# Patient Record
Sex: Female | Born: 1960 | Race: White | Hispanic: No | Marital: Married | Smoking: Never smoker
Health system: Southern US, Community
[De-identification: ages and names within clinical notes are randomized; demographics above are authoritative.]

## PROBLEM LIST (undated history)

## (undated) DIAGNOSIS — C569 Malignant neoplasm of unspecified ovary: Secondary | ICD-10-CM

## (undated) DIAGNOSIS — N289 Disorder of kidney and ureter, unspecified: Secondary | ICD-10-CM

## (undated) DIAGNOSIS — M1990 Unspecified osteoarthritis, unspecified site: Secondary | ICD-10-CM

## (undated) DIAGNOSIS — M329 Systemic lupus erythematosus, unspecified: Secondary | ICD-10-CM

## (undated) DIAGNOSIS — IMO0002 Reserved for concepts with insufficient information to code with codable children: Secondary | ICD-10-CM

## (undated) DIAGNOSIS — I10 Essential (primary) hypertension: Secondary | ICD-10-CM

## (undated) DIAGNOSIS — E7800 Pure hypercholesterolemia, unspecified: Secondary | ICD-10-CM

## (undated) DIAGNOSIS — C801 Malignant (primary) neoplasm, unspecified: Secondary | ICD-10-CM

## (undated) DIAGNOSIS — E079 Disorder of thyroid, unspecified: Secondary | ICD-10-CM

## (undated) HISTORY — PX: EXTERNAL EAR SURGERY: SHX627

---

## 2004-10-18 ENCOUNTER — Ambulatory Visit: Payer: Self-pay | Admitting: Gastroenterology

## 2004-11-17 ENCOUNTER — Ambulatory Visit: Payer: Self-pay | Admitting: Gastroenterology

## 2005-09-27 ENCOUNTER — Ambulatory Visit: Payer: Self-pay | Admitting: Family Medicine

## 2005-10-15 ENCOUNTER — Emergency Department: Payer: Self-pay | Admitting: Emergency Medicine

## 2005-10-19 ENCOUNTER — Emergency Department: Payer: Self-pay | Admitting: Emergency Medicine

## 2005-10-22 IMAGING — CT CT ABD-PELV W/ CM
1 of 3 series · 14 of 32 positions shown, 19 images · non-contrast
Comparison: none

REASON FOR EXAM: Abdominal pain
COMMENTS:

[Series 2: soft tissue · axial · 0.72mm/px · z∈[-482,-106]mm · 14 of 55 slices shown, 19 images]
[im 4/55  soft-tissue]
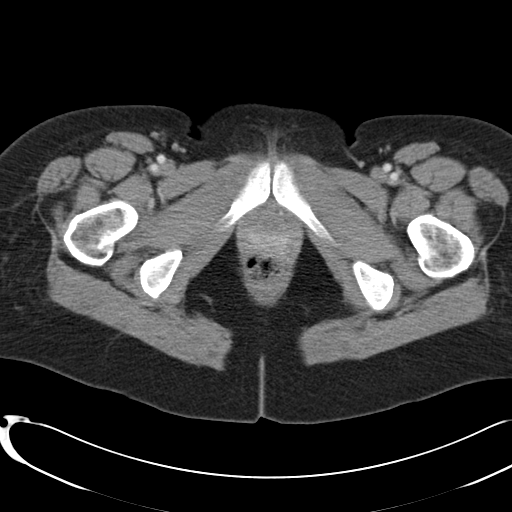
[im 4/55  bone]
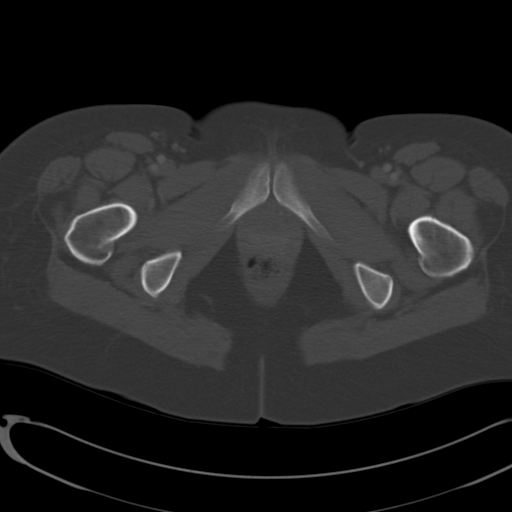
[im 7/55  soft-tissue]
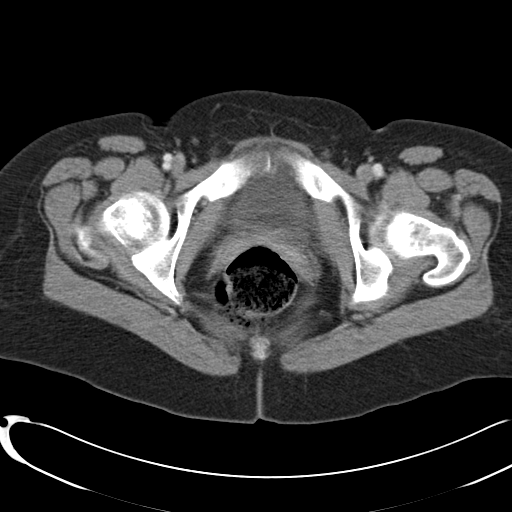
[im 13/55  soft-tissue]
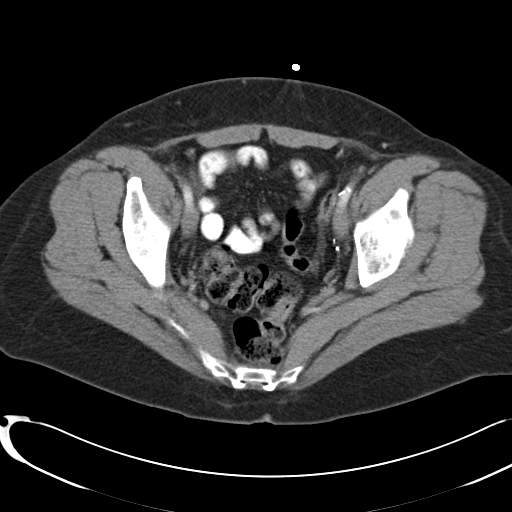
[im 16/55  soft-tissue]
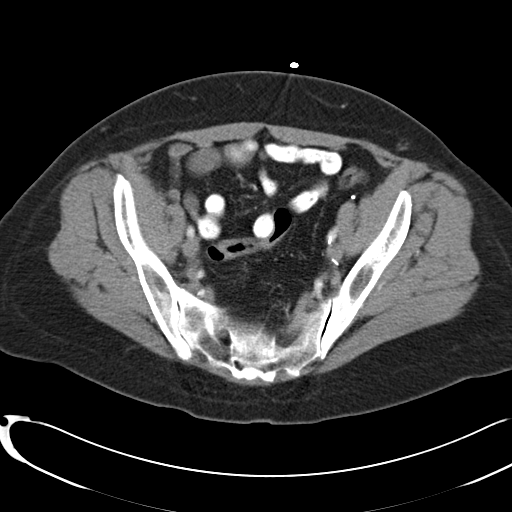
[im 20/55  soft-tissue]
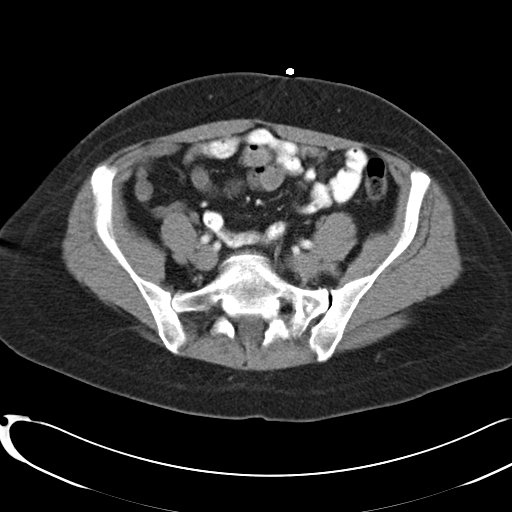
[im 23/55  soft-tissue]
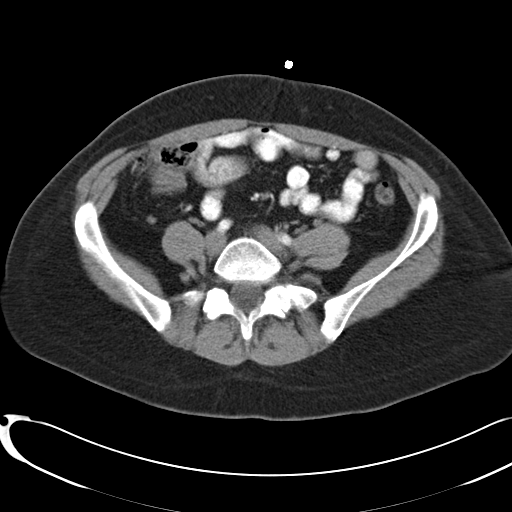
[im 29/55  soft-tissue]
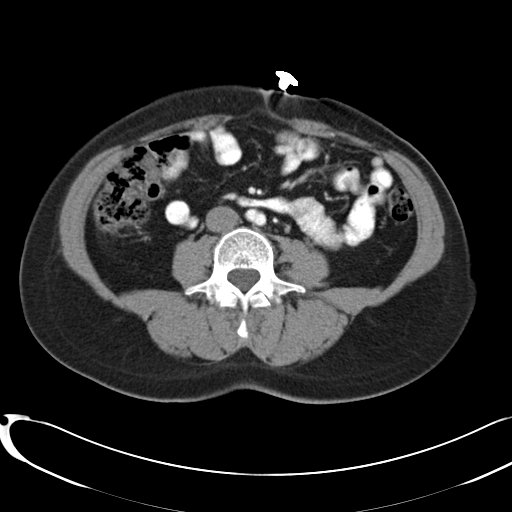
[im 32/55  soft-tissue]
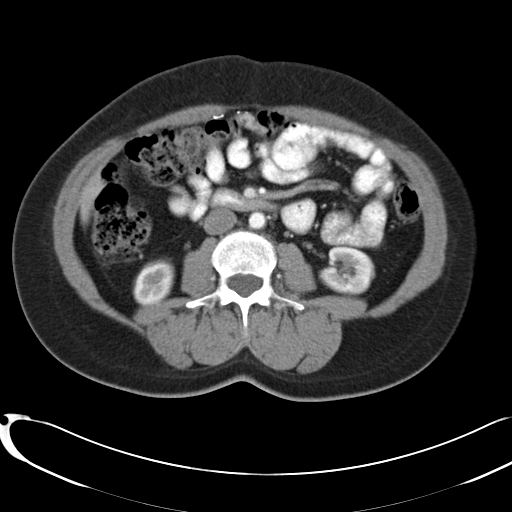
[im 35/55  soft-tissue]
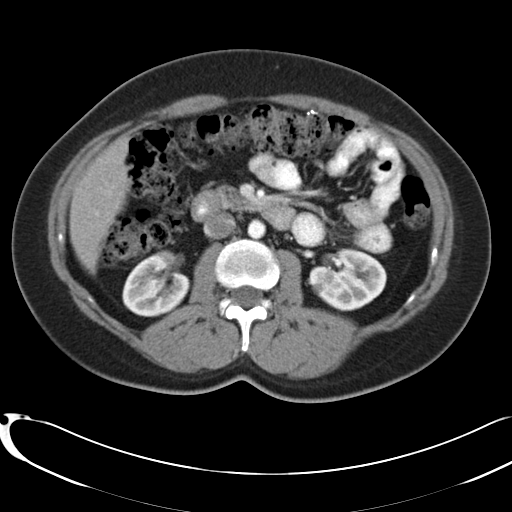
[im 35/55  bone]
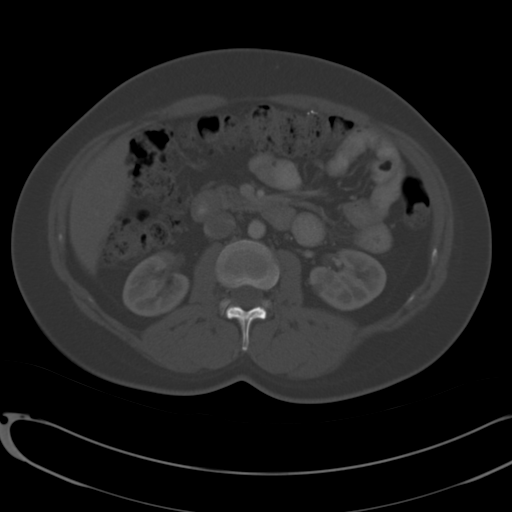
[im 39/55  soft-tissue]
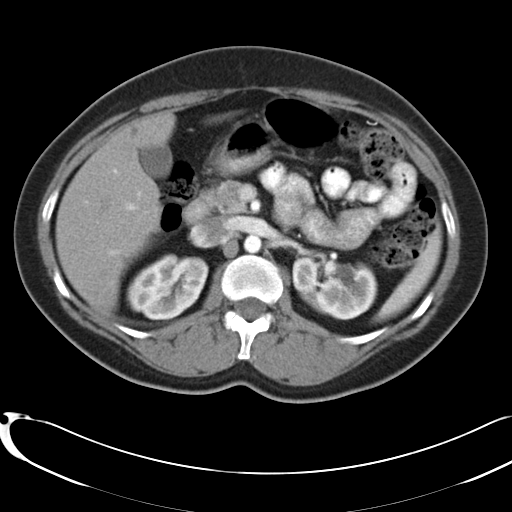
[im 42/55  soft-tissue]
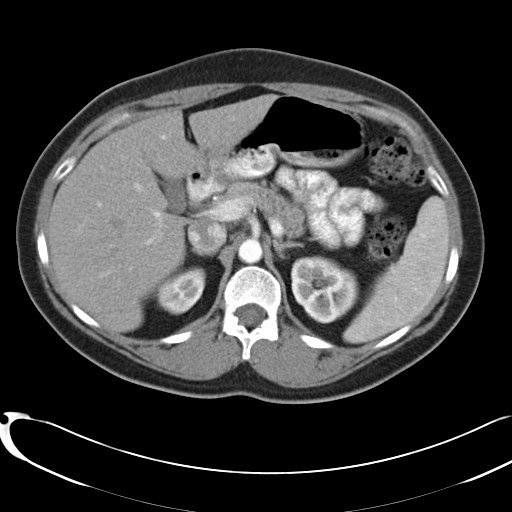
[im 42/55  lung]
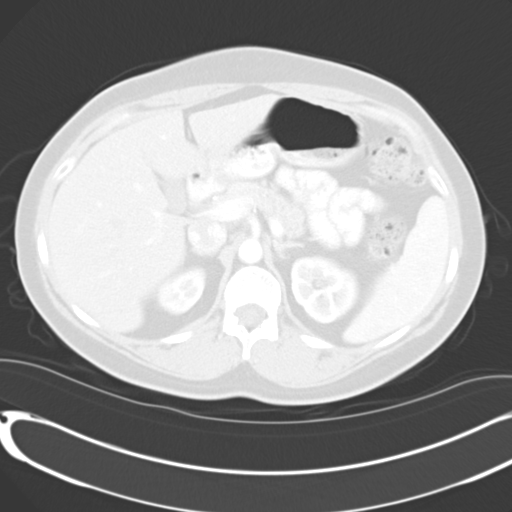
[im 45/55  lung]
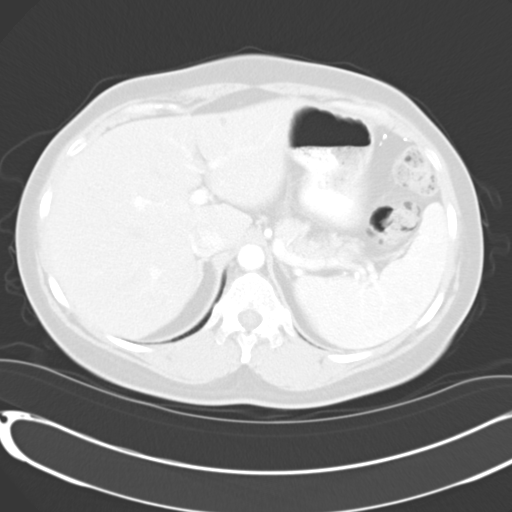
[im 48/55  soft-tissue]
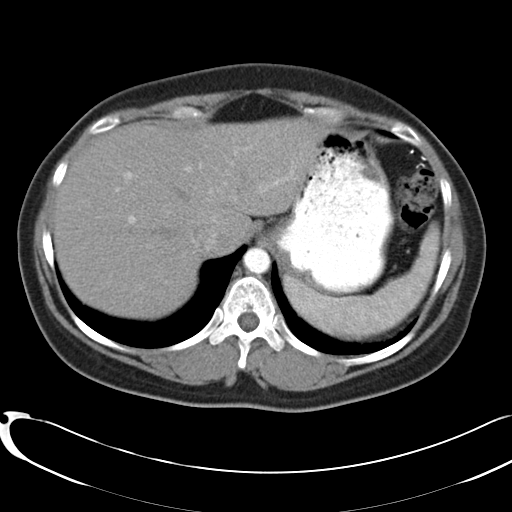
[im 48/55  lung]
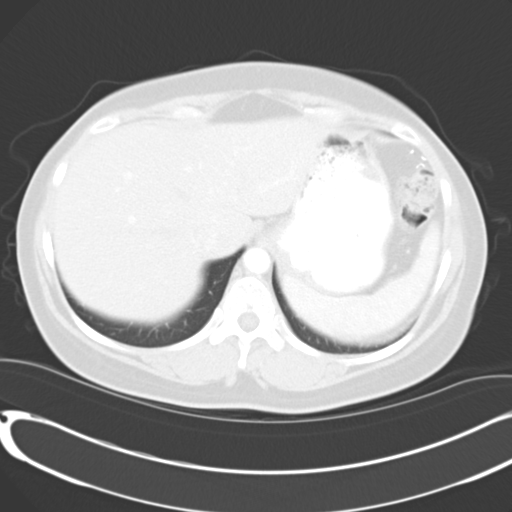
[im 51/55  soft-tissue]
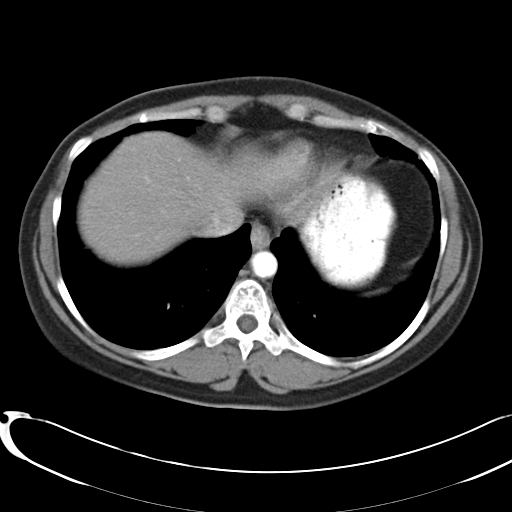
[im 51/55  lung]
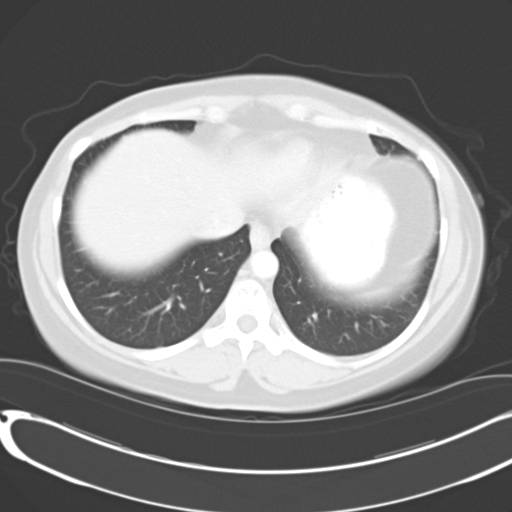

[14 of 32 positions shown; findings below may reference images not displayed]

PROCEDURE:     CT  - CT ABDOMEN / PELVIS  W  - October 18, 2004  [DATE]

RESULT:     8-mm helical cuts through the abdomen and pelvis were performed
with oral and 100 cc of Ysovue-NJB contrast.  Cuts through the lung bases
show no suspicious mass, nodule, or pneumonia.  The soft tissue windows
shows no pleural or pericardial effusion.  Cuts through the abdomen show
multiple low-density lesions in both lobes of the liver.  They likely
represent simple cysts or hemangiomas.  The largest is in the RIGHT lobe and
measures 1 cm in length.  No suspicious peripherally enhancing lesion is
identified.  The spleen, gallbladder, pancreas, adrenals and kidneys are
unremarkable.  No free intraperitoneal fluid, air, or adenopathy is noted.
Diverticular disease is seen in the sigmoid colon without evidence of acute
diverticulitis or abscess.  The bladder distends normally without evidence
of filling defect or wall thickening.  No inguinal mass or adenopathy is
noted.  The delayed images show persistence of these low-density lesions but
they appear to be smaller on the delayed studies suggesting they are most
likely hemangiomas.  Delayed images also show a subtle low-density lesion in
the midportion of the LEFT kidney which I suspect is volume averaging as it
is essentially too small to characterize.  Neither kidney shows obvious
obstructive uropathy.
IMPRESSION: Multiple low-density lesions are noted throughout both lobes of the liver.
The largest is in the RIGHT lobe and measures 1.1 cm.  They appear to be
smaller on the delayed study suggesting they are likely  hemangiomas.

No other suspicious solid organ abnormality is identified.  No free
intraperitoneal fluid, air, or adenopathy.

No evidence of mesenteric inflammatory process.

Lung bases are clear.

## 2006-10-01 IMAGING — CT CT HEAD WITHOUT AND WITH CONTRAST
2 series · 15 of 30 positions shown, 19 images · non-contrast
Comparison: none

REASON FOR EXAM: HA EVAL INTRACRANIAL PROCESS VS. CHRONIC SINUSITIS
COMMENTS:

[Series 2: without · axial · non-contrast · 0.41mm/px · z∈[-214,-84]mm · 13 of 32 slices shown, 17 images]
[im 3/32  brain]
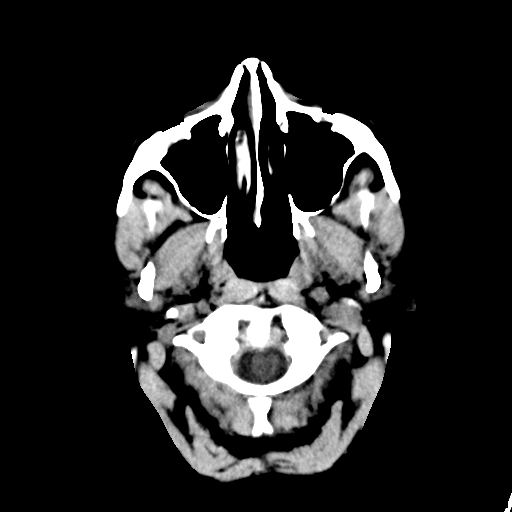
[im 3/32  bone]
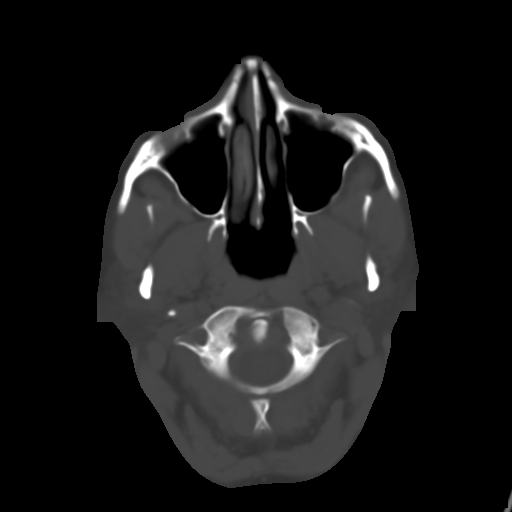
[im 5/32  brain]
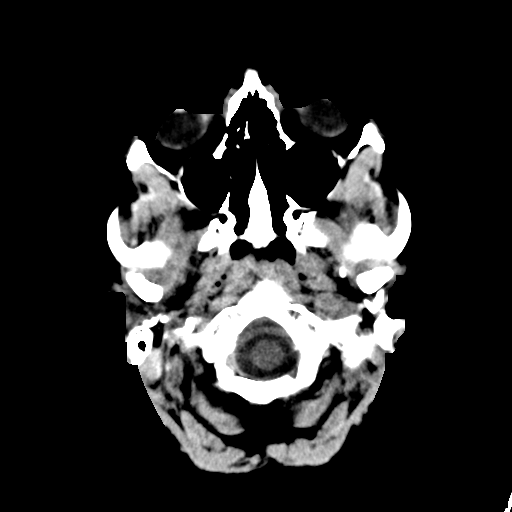
[im 7/32  brain]
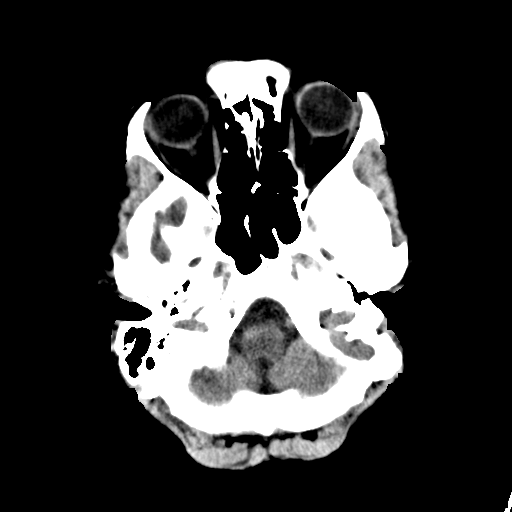
[im 9/32  brain]
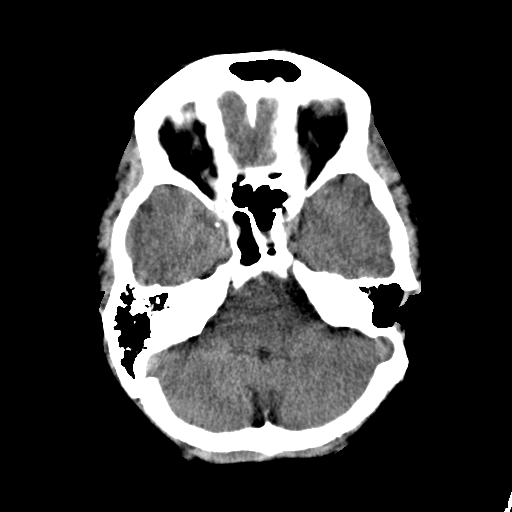
[im 12/32  brain]
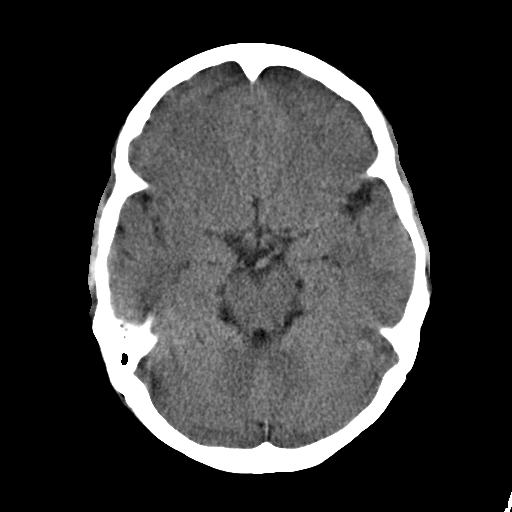
[im 12/32  bone]
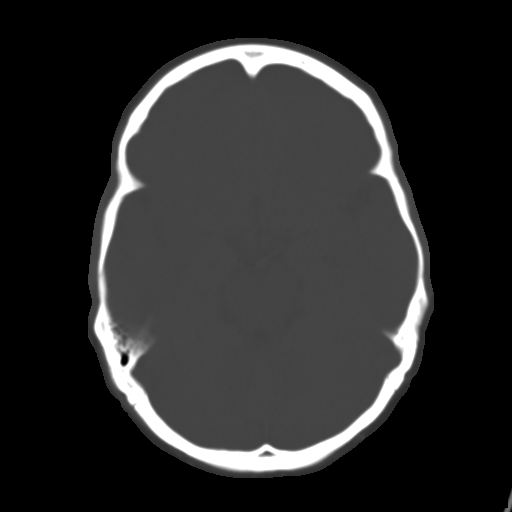
[im 14/32  brain]
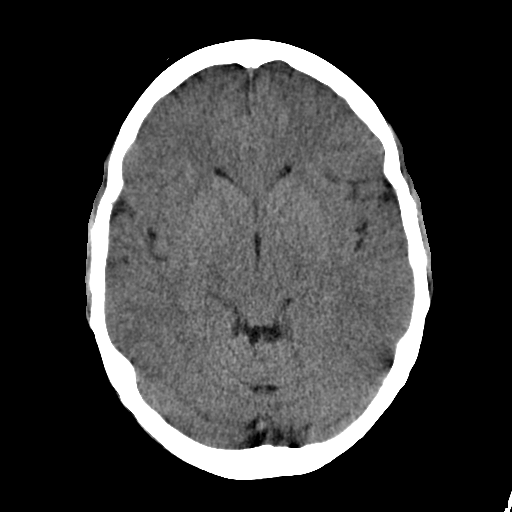
[im 16/32  brain]
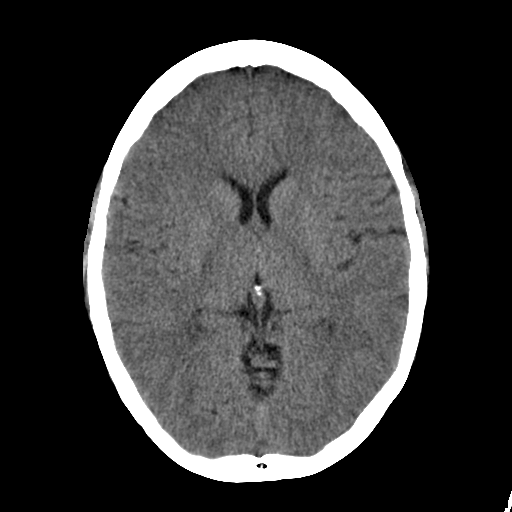
[im 18/32  brain]
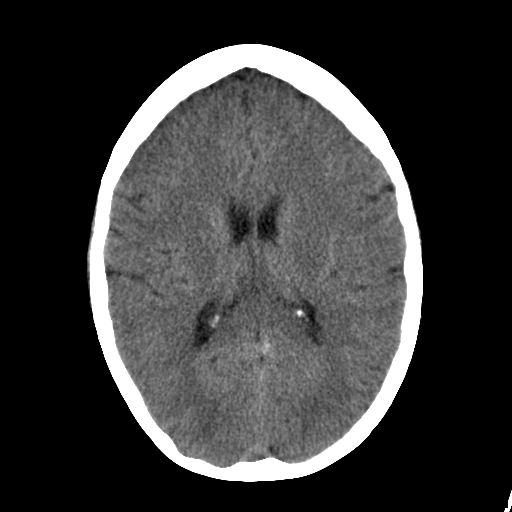
[im 20/32  brain]
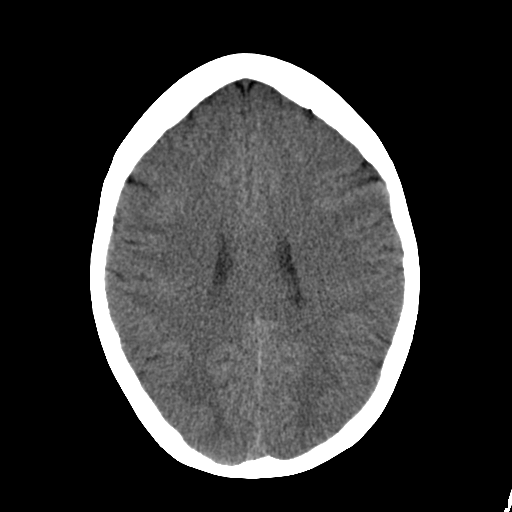
[im 20/32  bone]
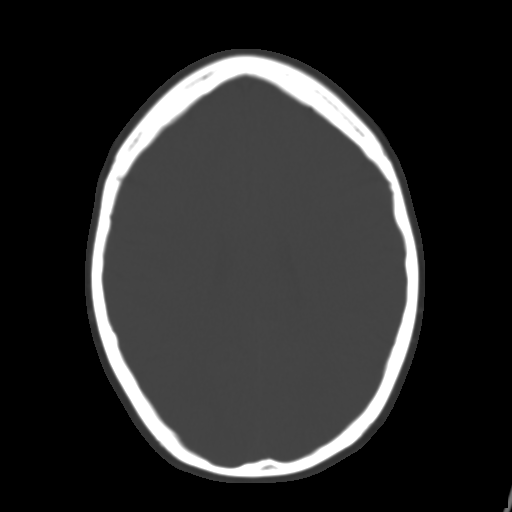
[im 23/32  brain]
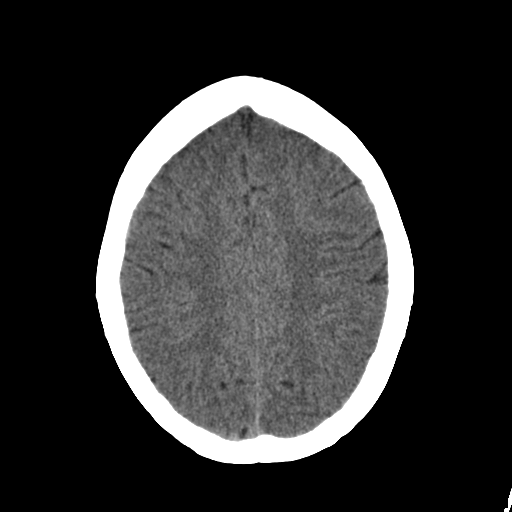
[im 25/32  brain]
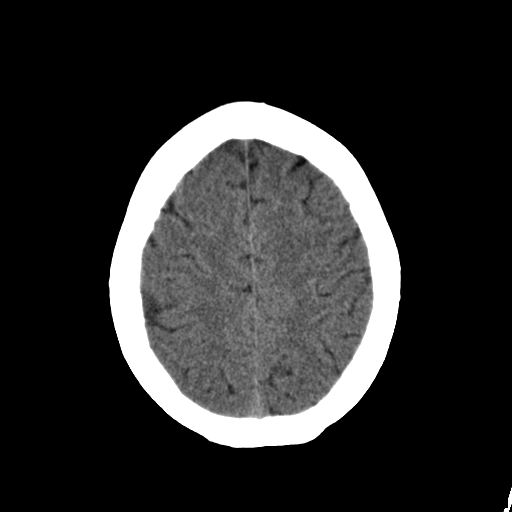
[im 27/32  brain]
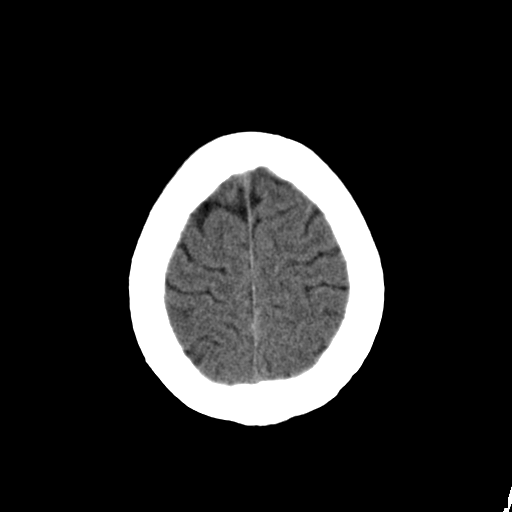
[im 29/32  brain]
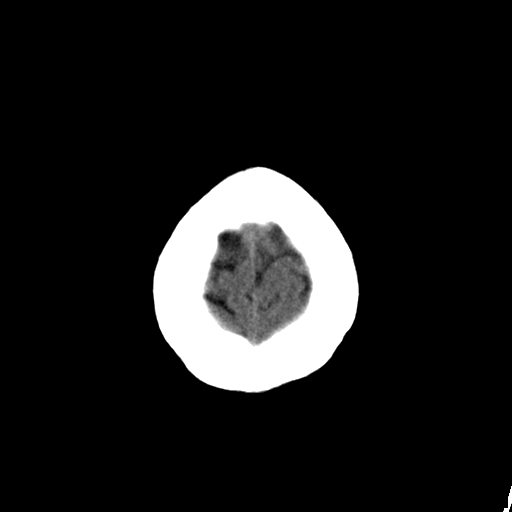
[im 29/32  bone]
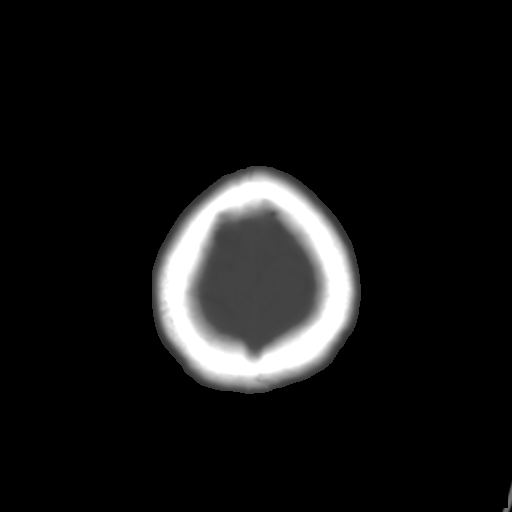

[Series 3: bone · axial · 0.41mm/px · z∈[-214,-194]mm · 2 of 32 slices shown]
[im 3/32  bone]
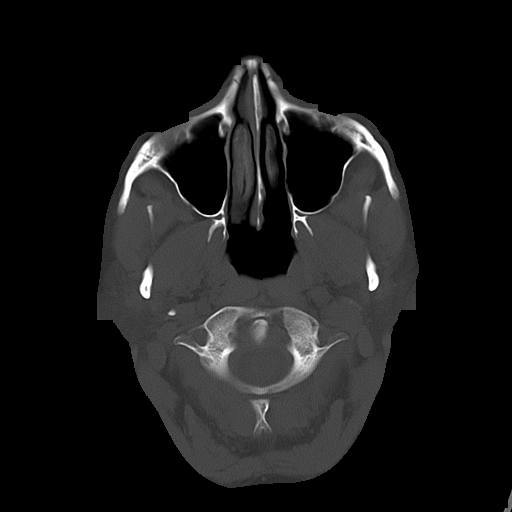
[im 7/32  bone]
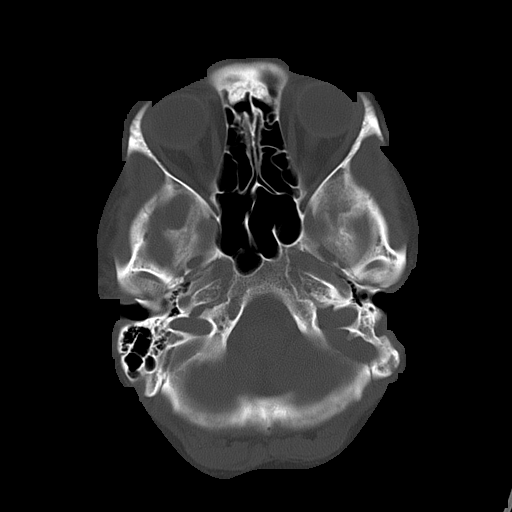

[15 of 30 positions shown; findings below may reference images not displayed]

PROCEDURE:     CT  - CT HEAD W/WO  - September 27, 2005  [DATE]

RESULT:     The patient has no prior study for comparison.  The pre-contrast
images show the ventricles and sulci appear to be within normal limits.
There is no evidence of an area of hemorrhage. There is no mass effect or
midline shift. There is no extra-axial hemorrhage.

Following intravenous administration of Gadolinium there is no evidence of
an abnormal area of enhancement to suggest an underlying mass or vascular
malformation.

Bone window images show normal aeration of the paranasal sinuses included on
the study. No underlying calvarial mass or destructive process is
demonstrated.
IMPRESSION: 1)No significant intracranial abnormality.

2)No CT evidence of acute sinusitis although only the mid portion of the
maxillary sinuses through the frontal sinuses is included and the floor of
the maxillary sinuses were not seen.

## 2006-10-23 IMAGING — CT CT STONE STUDY
1 of 2 series · 16 of 32 positions shown, 20 images · non-contrast
Comparison: none

REASON FOR EXAM: rm 10   back pain      stone study
COMMENTS:

[Series 2: stone · axial · 0.73mm/px · z∈[-440,-32]mm · 16 of 153 slices shown, 20 images]
[im 11/153  soft-tissue]
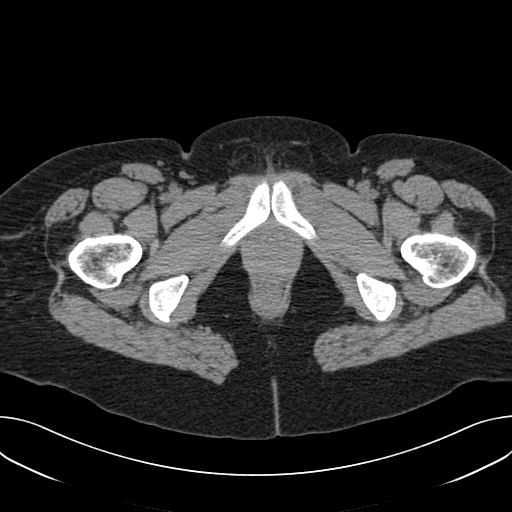
[im 11/153  bone]
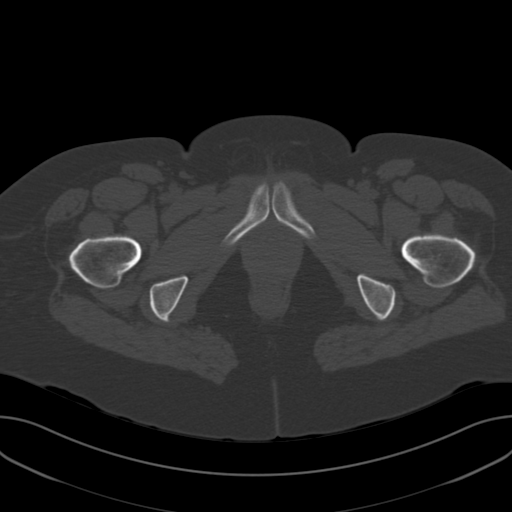
[im 21/153  soft-tissue]
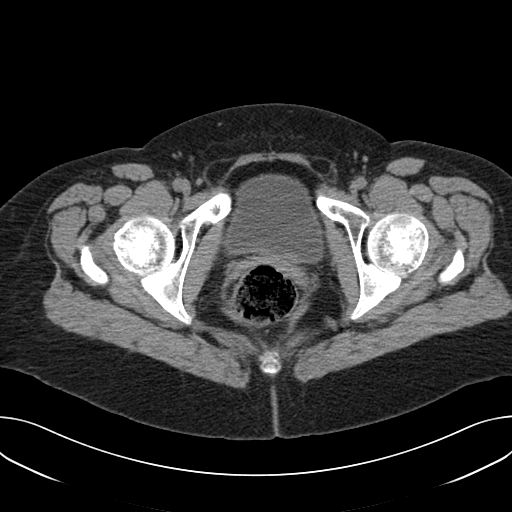
[im 32/153  soft-tissue]
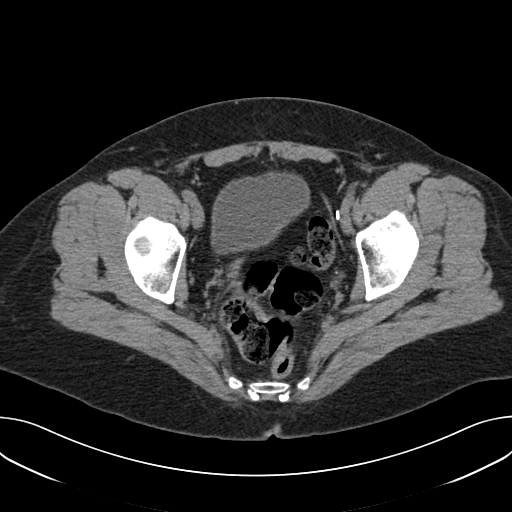
[im 42/153  soft-tissue]
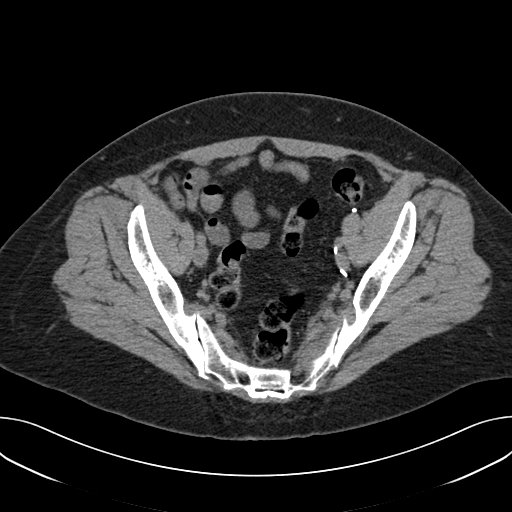
[im 53/153  soft-tissue]
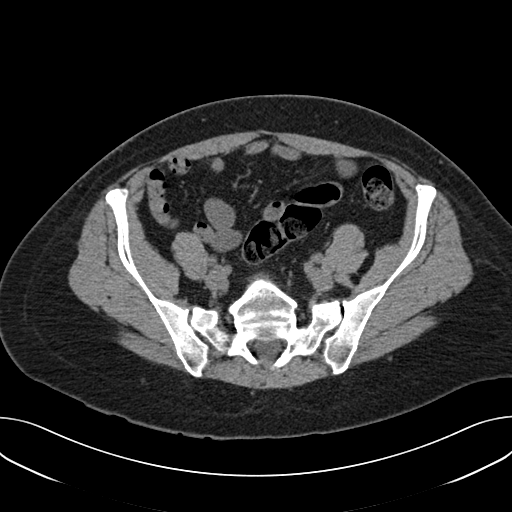
[im 63/153  soft-tissue]
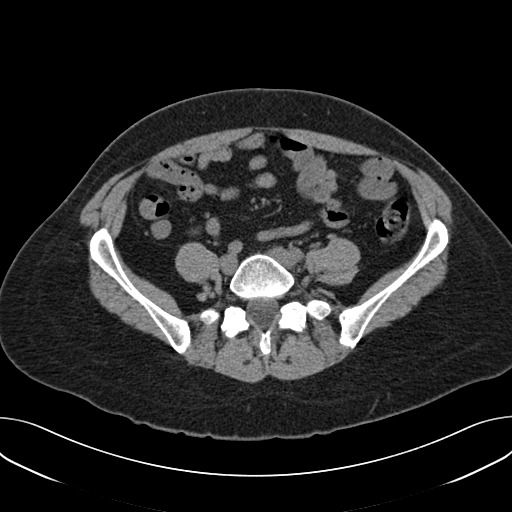
[im 74/153  soft-tissue]
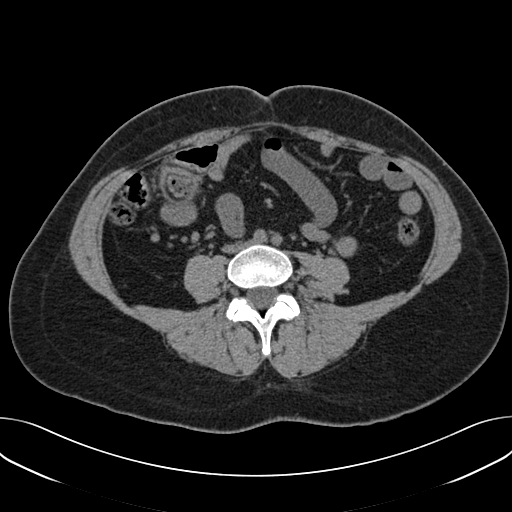
[im 84/153  soft-tissue]
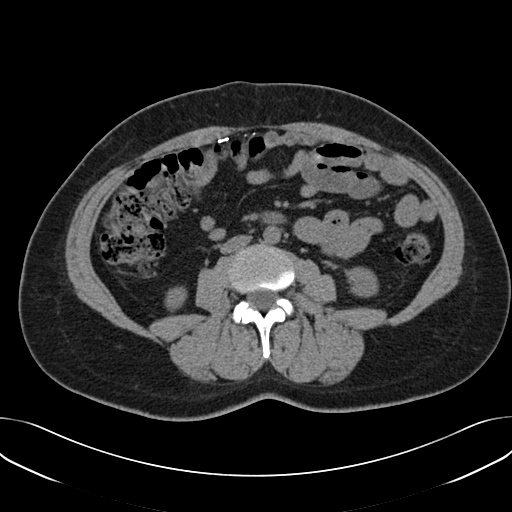
[im 95/153  soft-tissue]
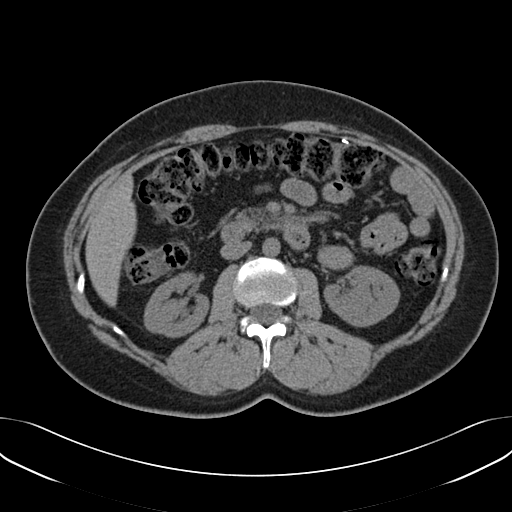
[im 95/153  bone]
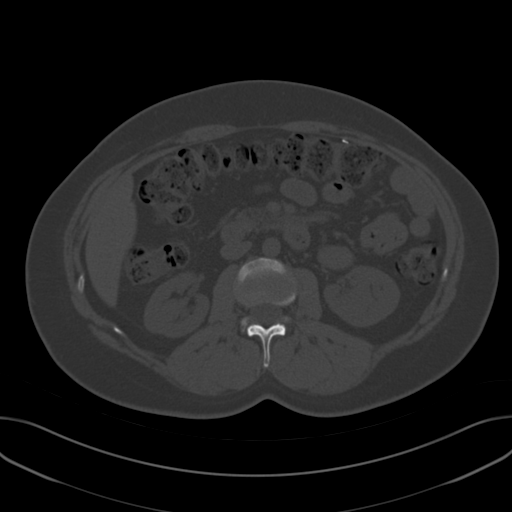
[im 105/153  soft-tissue]
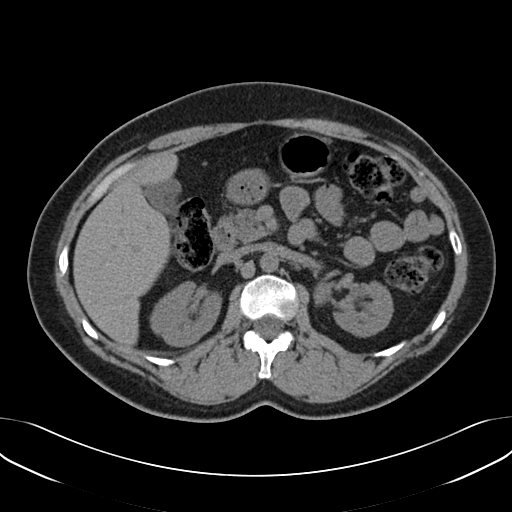
[im 116/153  soft-tissue]
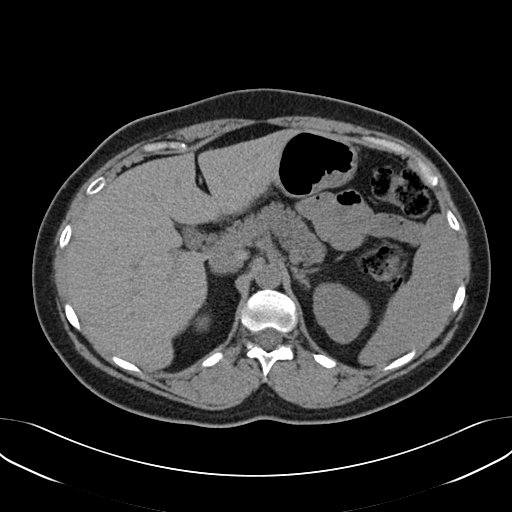
[im 126/153  soft-tissue]
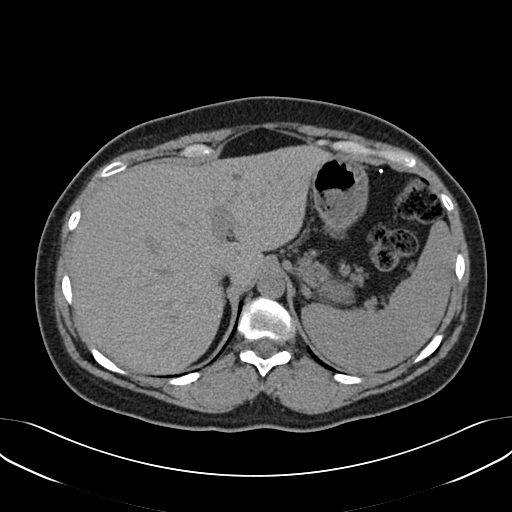
[im 132/153  lung]
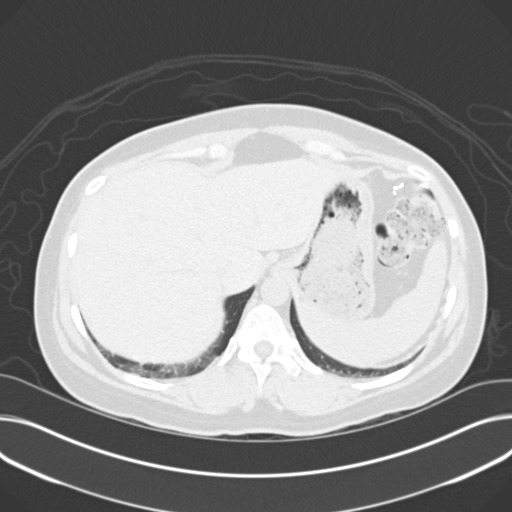
[im 137/153  soft-tissue]
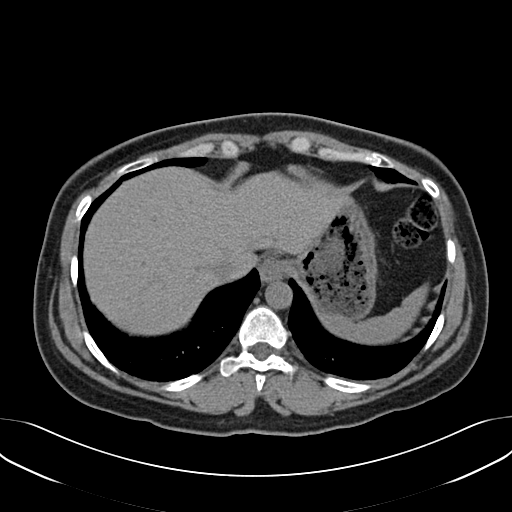
[im 137/153  lung]
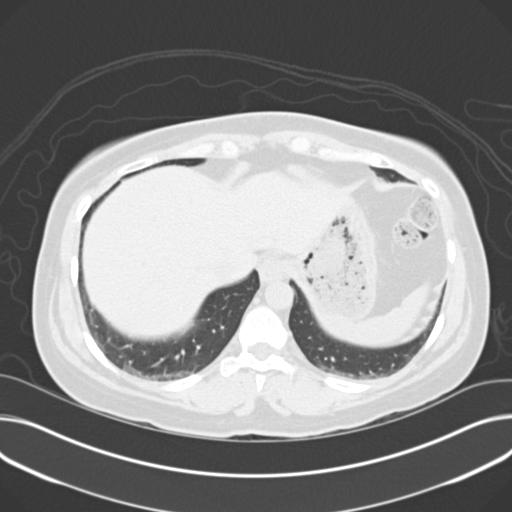
[im 142/153  lung]
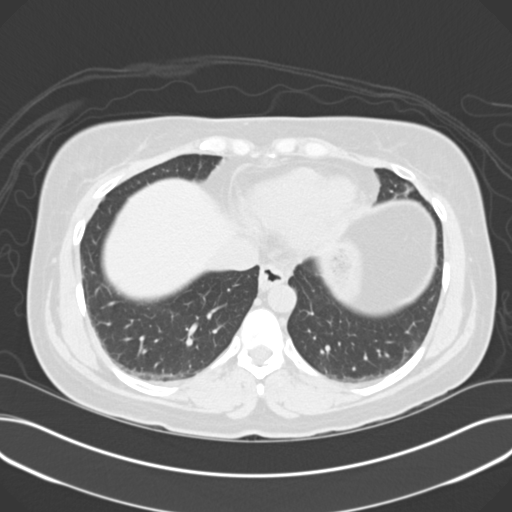
[im 147/153  soft-tissue]
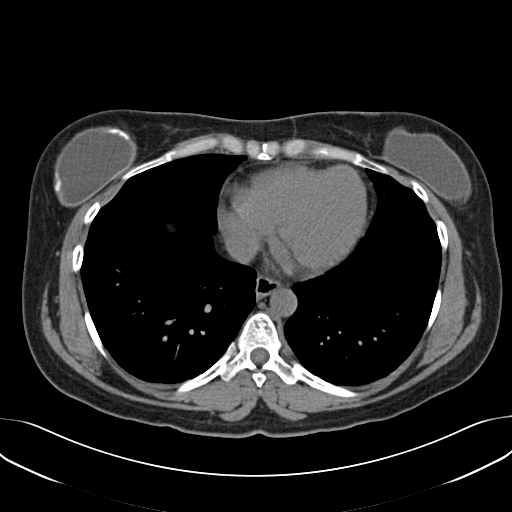
[im 147/153  lung]
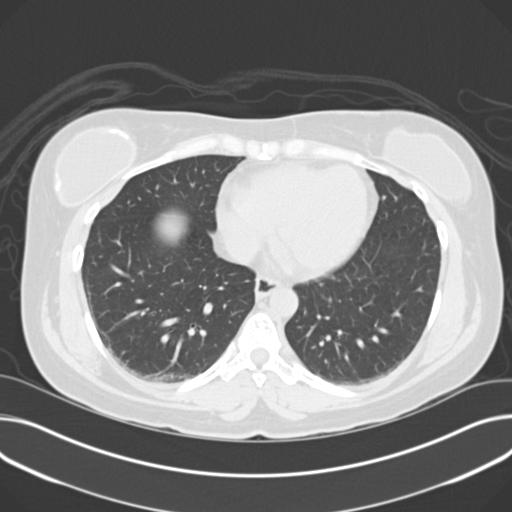

[16 of 32 positions shown; findings below may reference images not displayed]

PROCEDURE:     CT  - CT ABDOMEN /PELVIS WO (STONE)  - October 20, 2005 [DATE]

RESULT:     Non-contrast 3 mm sections were obtained from the lung bases
through the pubic symphysis.

Evaluation of the lung bases demonstrates bibasilar hypoventilation.
Bilateral breast implants are appreciated.

There is no evidence of hydronephrosis, masses nor calculi.  Incidental note
is made of duplication of the LEFT urinary collecting system.  Non-contrast
evaluation of the solid abdominal organ viscera demonstrates no
abnormalities. There is no evidence of abdominal or pelvic free fluid,
drainable loculated fluid collections or masses. Evaluation of the RIGHT and
LEFT kidneys demonstrates no hydronephrosis, masses or calculi.
IMPRESSION: 1)No evidence of renal calculus disease as described above.

2)A preliminary faxed report was relayed to Dr. Dandla Alanazi of the

## 2008-07-16 ENCOUNTER — Emergency Department: Payer: Self-pay | Admitting: Emergency Medicine

## 2008-09-08 ENCOUNTER — Emergency Department: Payer: Self-pay | Admitting: Emergency Medicine

## 2008-12-02 ENCOUNTER — Emergency Department: Payer: Self-pay | Admitting: Unknown Physician Specialty

## 2009-07-20 IMAGING — CT CT HEAD WITHOUT CONTRAST
2 series · 16 of 30 positions shown, 20 images · non-contrast
Comparison: none

REASON FOR EXAM: headache
COMMENTS:

PROCEDURE:     CT  - CT HEAD WITHOUT CONTRAST  - July 16, 2008  [DATE]
RESULT:     Comparison: 09/27/2005
TECHNIQUE: Multiple axial images from the foramen magnum to the vertex were
obtained without IV contrast.

[Series 2: without · axial · non-contrast · 0.39mm/px · z∈[+1149,+1274]mm · 13 of 31 slices shown, 17 images]
[im 3/31  brain]
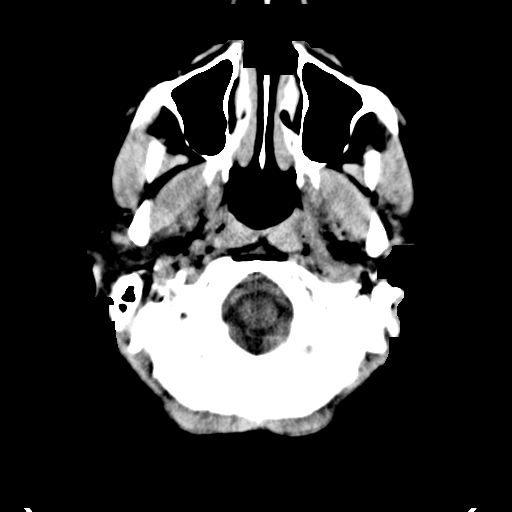
[im 3/31  bone]
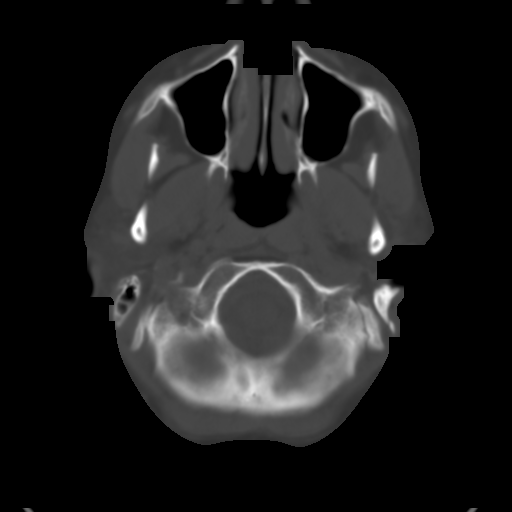
[im 5/31  brain]
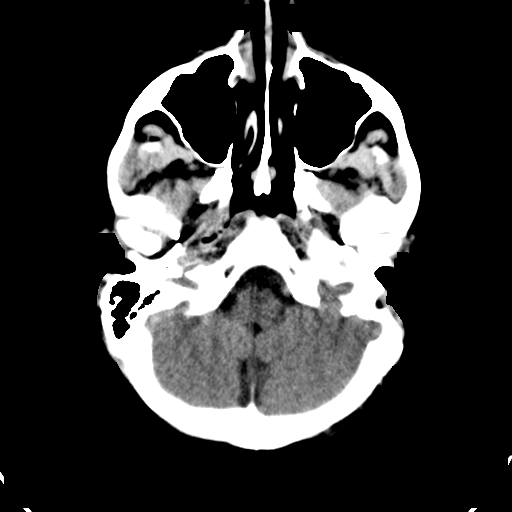
[im 7/31  brain]
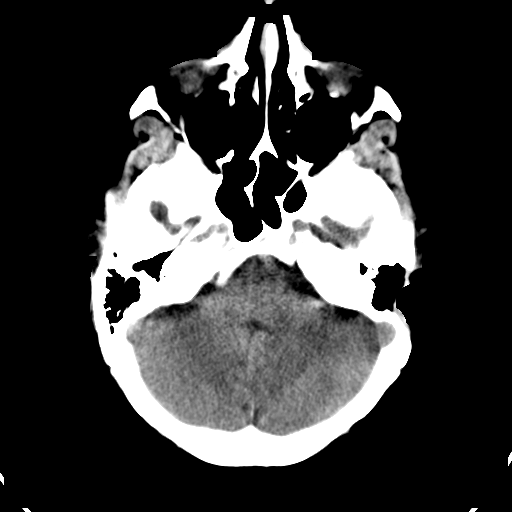
[im 9/31  brain]
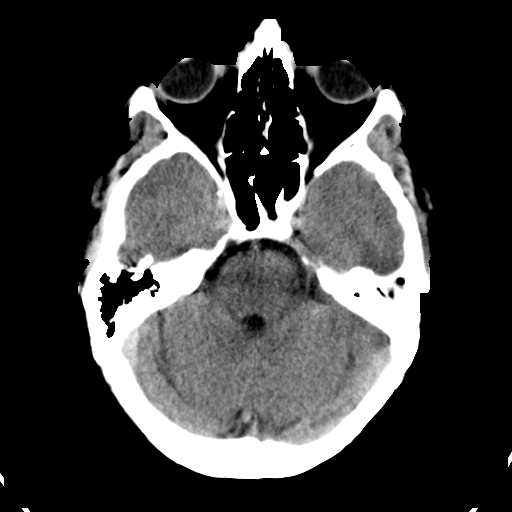
[im 11/31  brain]
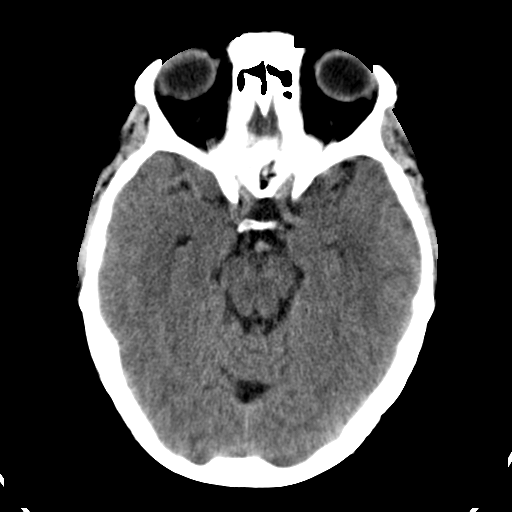
[im 11/31  bone]
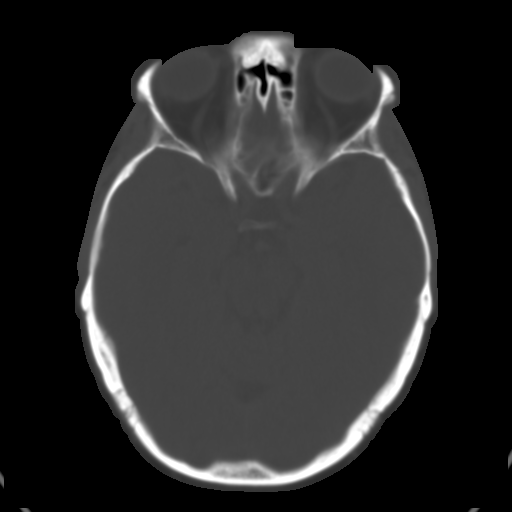
[im 13/31  brain]
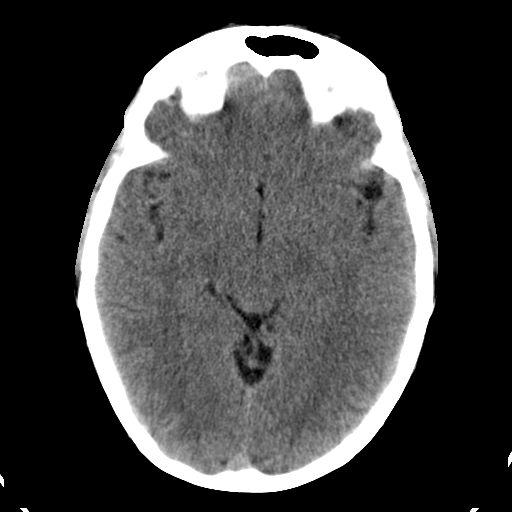
[im 16/31  brain]
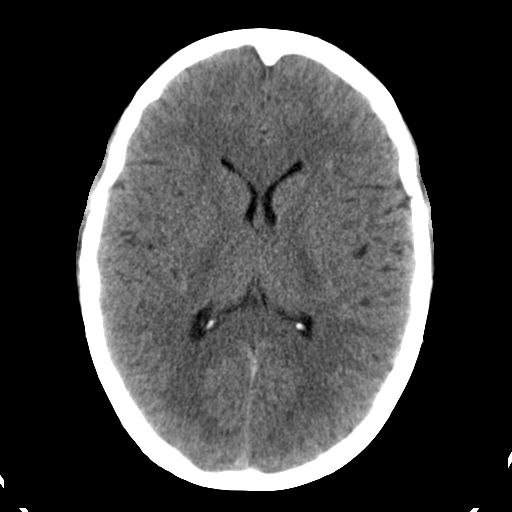
[im 18/31  brain]
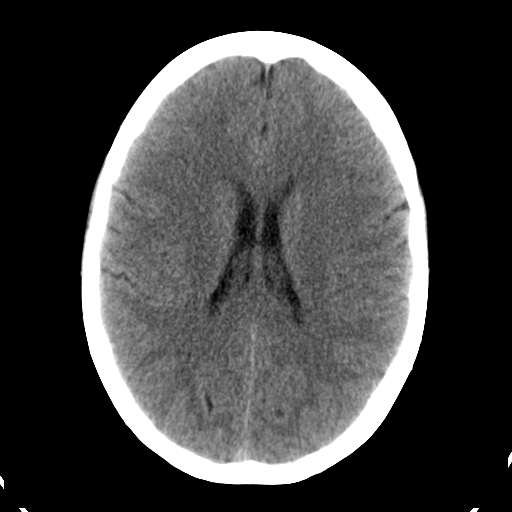
[im 20/31  brain]
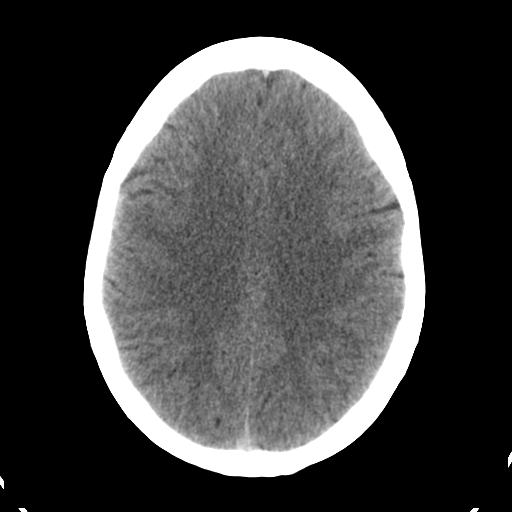
[im 20/31  bone]
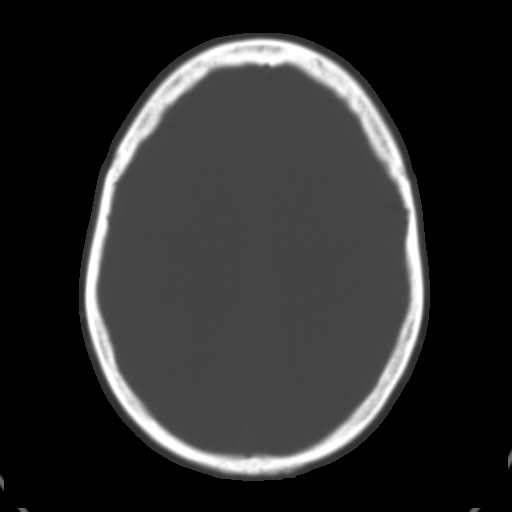
[im 22/31  brain]
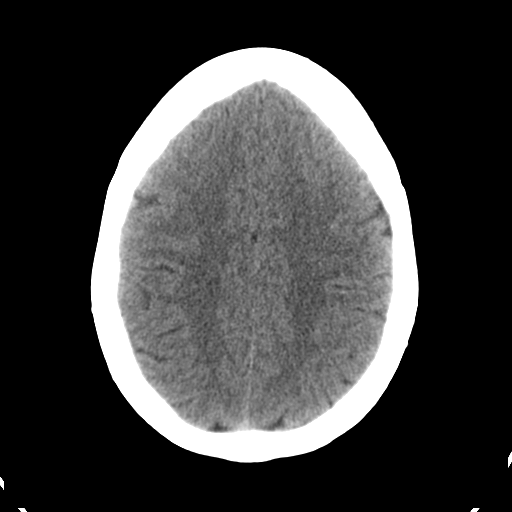
[im 24/31  brain]
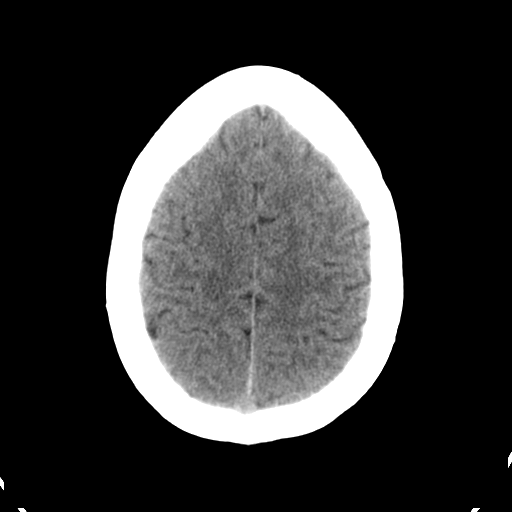
[im 26/31  brain]
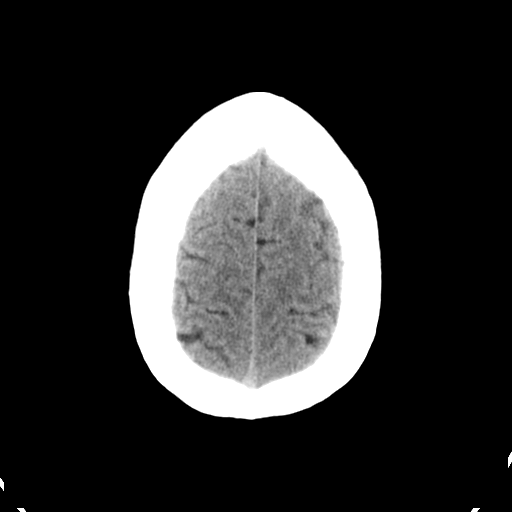
[im 28/31  brain]
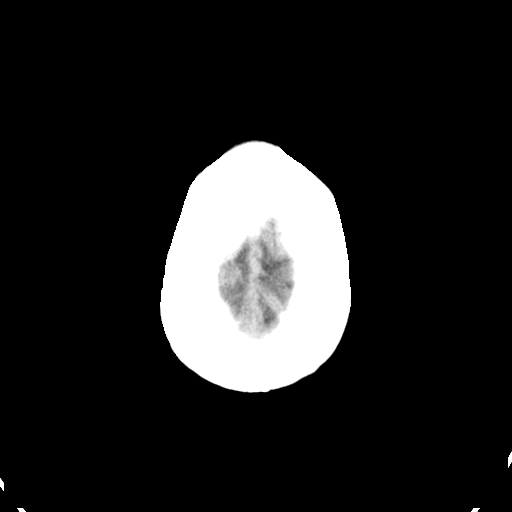
[im 28/31  bone]
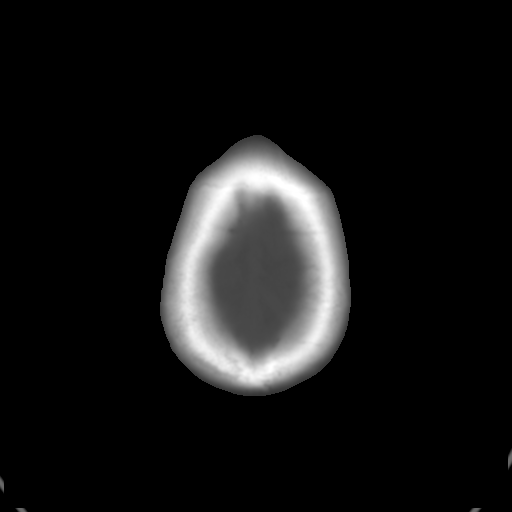

[Series 3: bone · axial · 0.39mm/px · z∈[+1149,+1189]mm · 3 of 31 slices shown]
[im 3/31  bone]
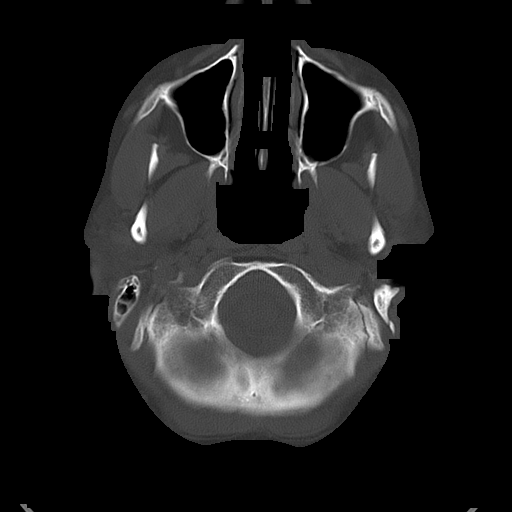
[im 7/31  bone]
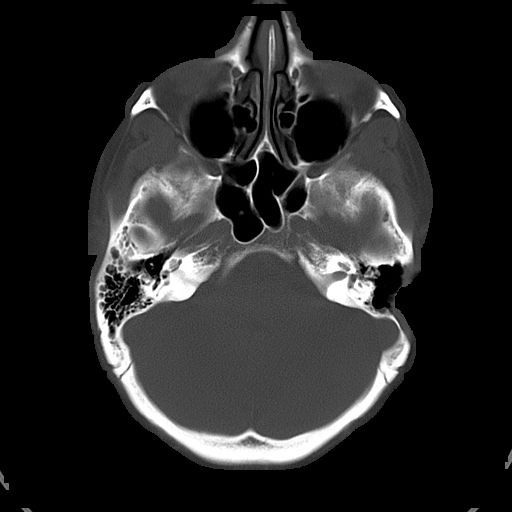
[im 11/31  bone]
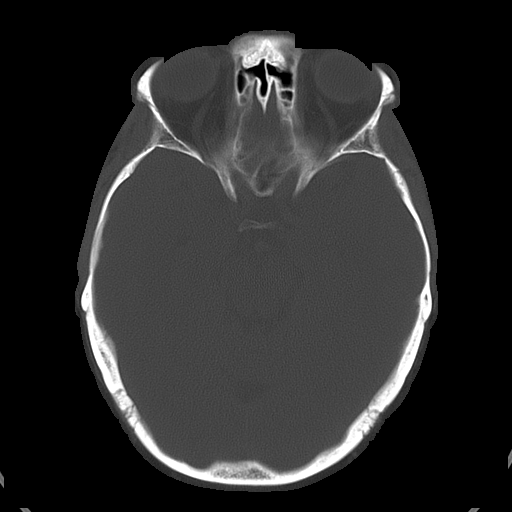

[16 of 30 positions shown; findings below may reference images not displayed]

FINDINGS: There is no evidence for mass effect, midline shift, or extra-axial fluid
collections.  There is no evidence for space-occupying lesion or
intracranial hemorrhage. There is no evidence for a cortical-based area of
acute infarction.

Ventricles and sulci are appropriate for the patient's age. The basal
cisterns are patent.

Visualized portions of the orbits are unremarkable. The paranasal sinuses
and mastoid air cells are unremarkable.

The osseous structures are unremarkable.
IMPRESSION: No acute intracranial process.

## 2009-09-13 IMAGING — CT CT CERVICAL SPINE WITHOUT CONTRAST
1 series · 12 of 14 positions shown, 15 images · non-contrast
Comparison: none

REASON FOR EXAM: neck pain with increased kyphosis tender to palpation
COMMENTS:   LMP: Post-Menopausal

[Series 5: axial · axial · 0.30mm/px · z∈[-170,-24]mm · 12 of 91 slices shown, 15 images]
[im 7/91  soft-tissue]
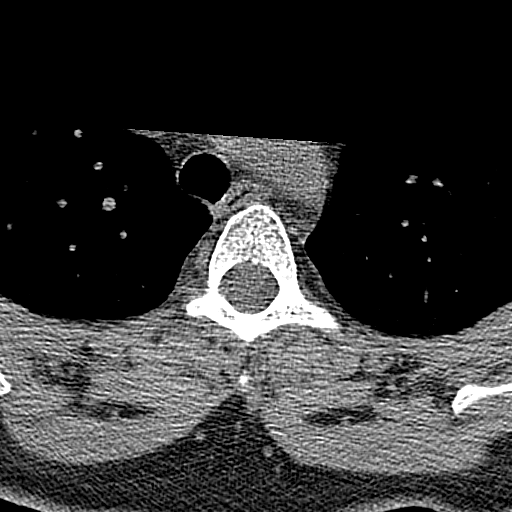
[im 7/91  bone]
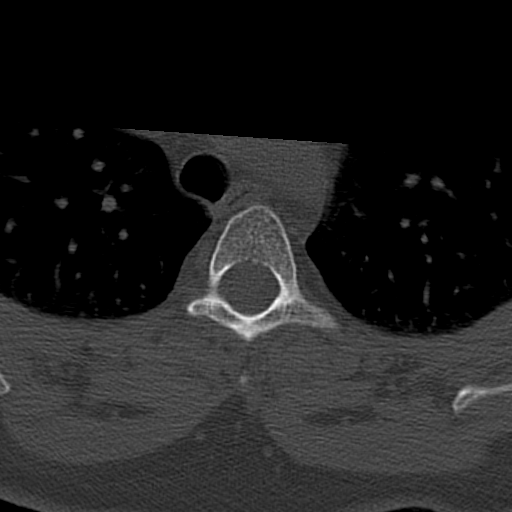
[im 14/91  bone]
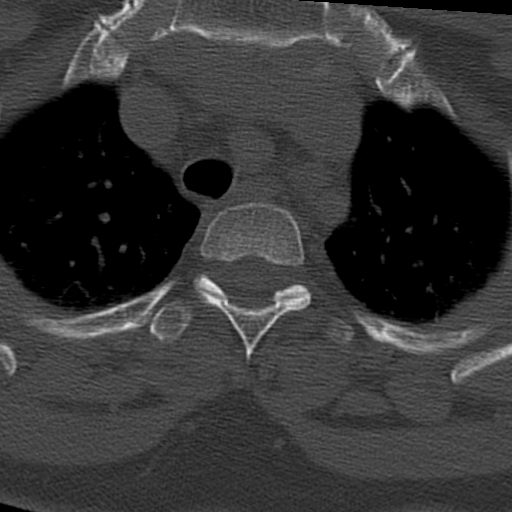
[im 21/91  bone]
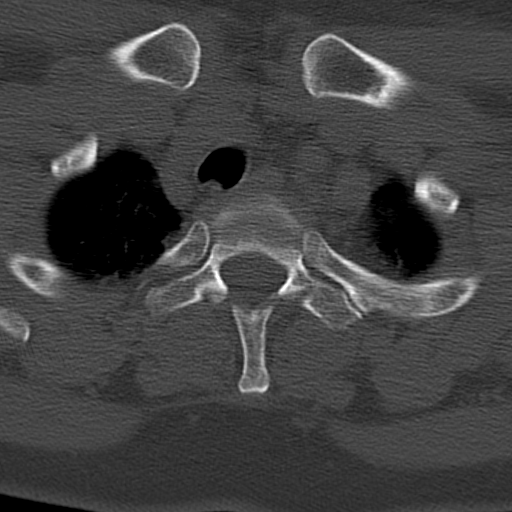
[im 28/91  bone]
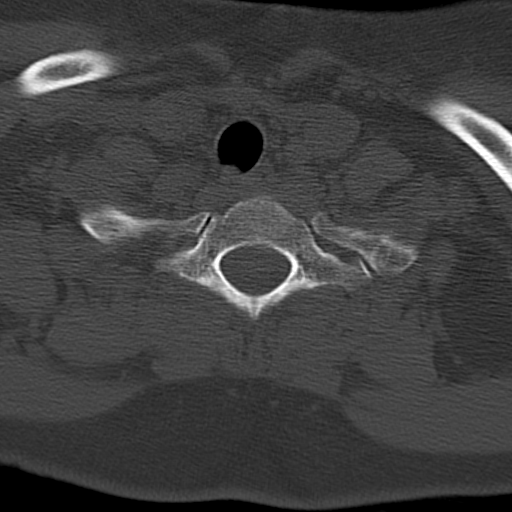
[im 35/91  soft-tissue]
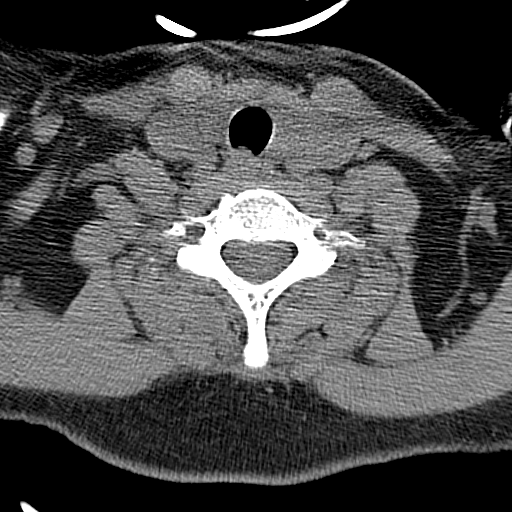
[im 35/91  bone]
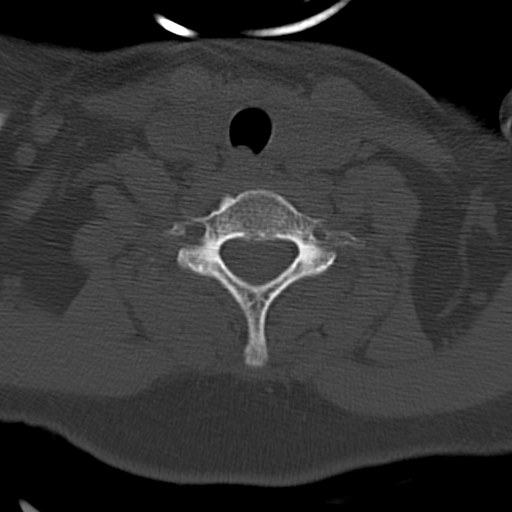
[im 42/91  bone]
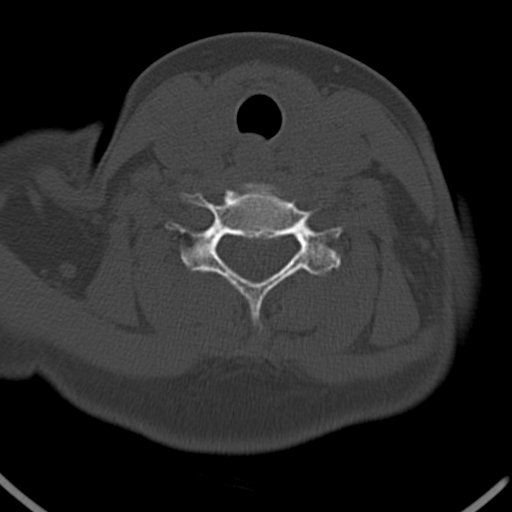
[im 49/91  bone]
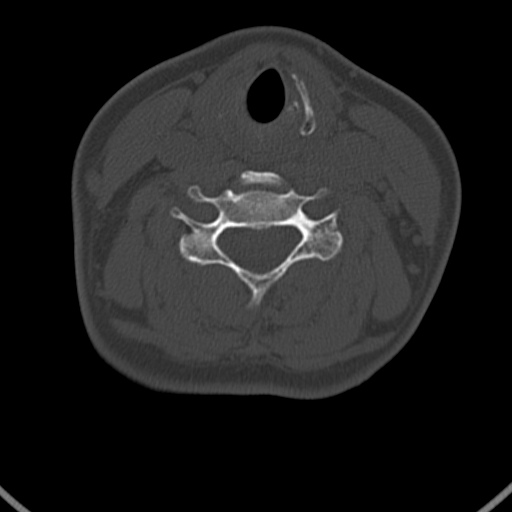
[im 56/91  bone]
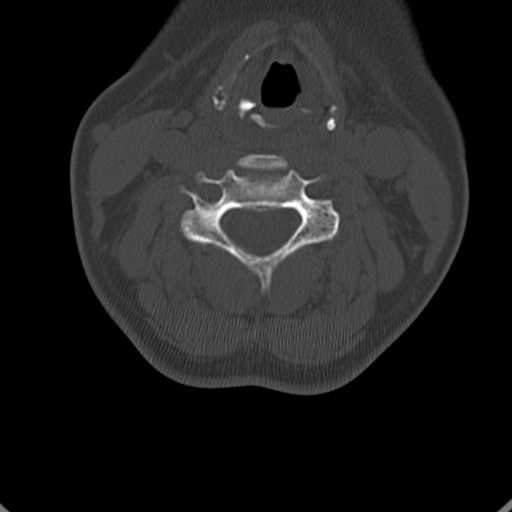
[im 63/91  soft-tissue]
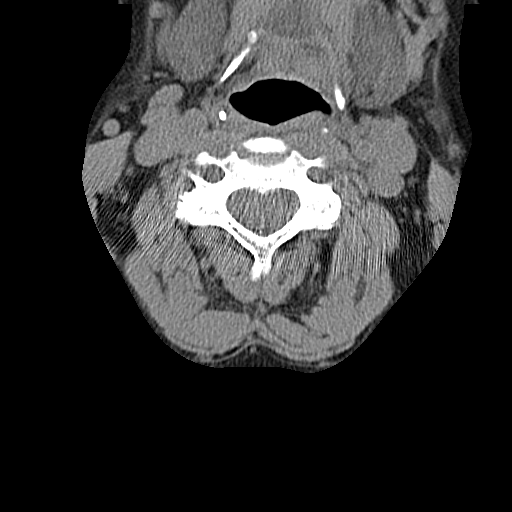
[im 63/91  bone]
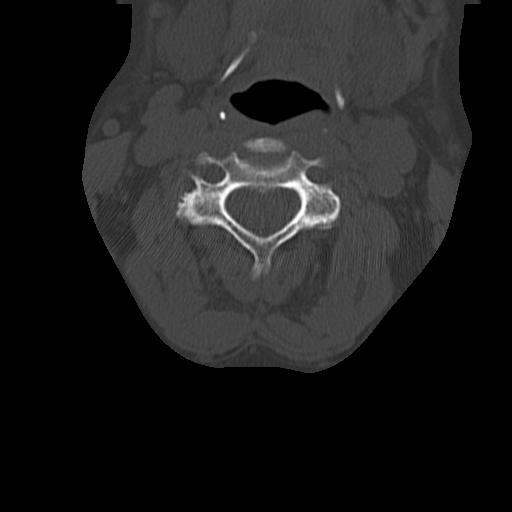
[im 70/91  bone]
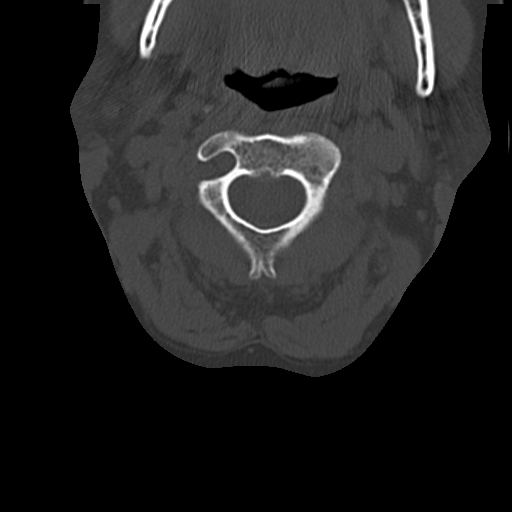
[im 77/91  bone]
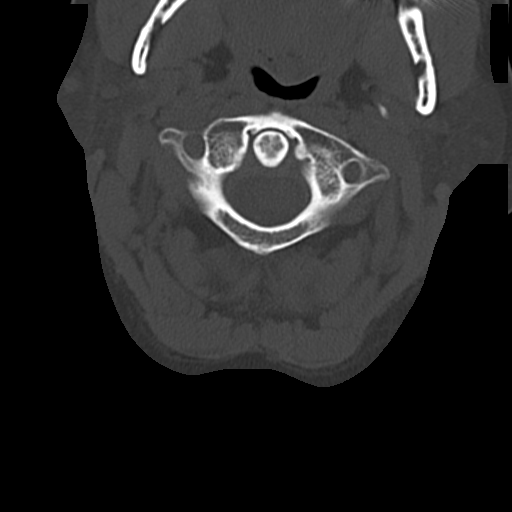
[im 84/91  bone]
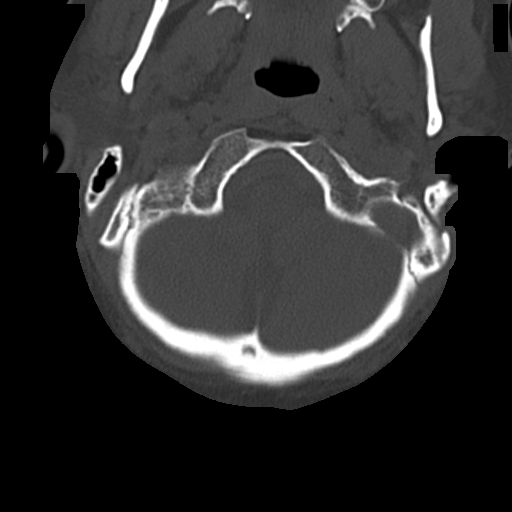

[12 of 14 positions shown; findings below may reference images not displayed]

PROCEDURE:     CT  - CT CERVICAL SPINE WO  - September 09, 2008  [DATE]

RESULT:     Noncontrast emergent CT of the cervical spine is reconstructed
in the axial, sagittal and coronal planes at bone window settings. There is
no previous exam for comparison. There is reversal of the normal cervical
lordosis at C4-C5 possibly with minimal anterolisthesis of C4 on C5. There
is disc space narrowing at C5-C6 and C6-C7. There is no evidence of a
cervical spine fracture. The prevertebral soft tissues are normal. The
craniocervical junction appears to be unremarkable. The odontoid is intact.
IMPRESSION: Degenerative changes as described with reversal of the normal cervical
lordosis. No acute bony abnormality. If there is concern for spinal or
foraminal narrowing, MRI would be recommended.

## 2009-09-13 IMAGING — CR DG CHEST 2V
1 series · 2 of 2 positions shown · non-contrast
Comparison: none

REASON FOR EXAM: chest pain
COMMENTS:

[Series 1: view not recorded · 0.17mm/px · 2 of 2 slices shown]
[im 1/2]
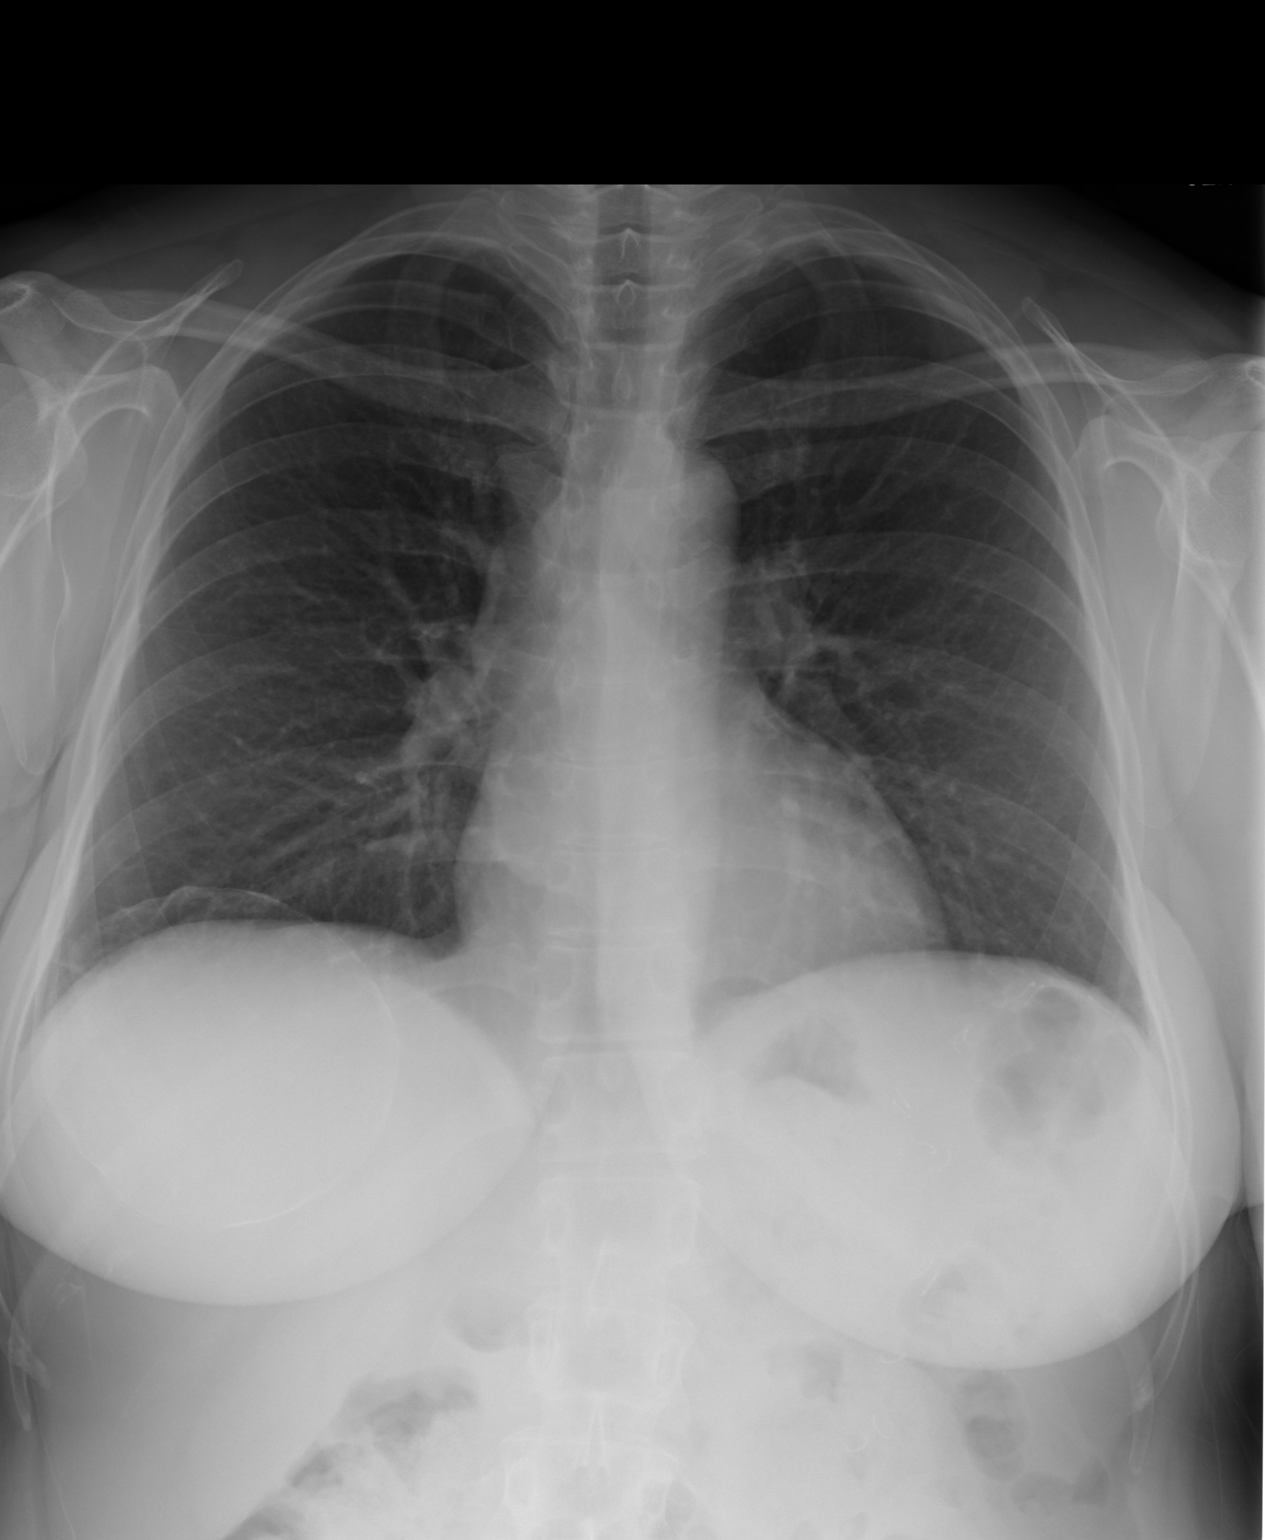
[im 2/2]
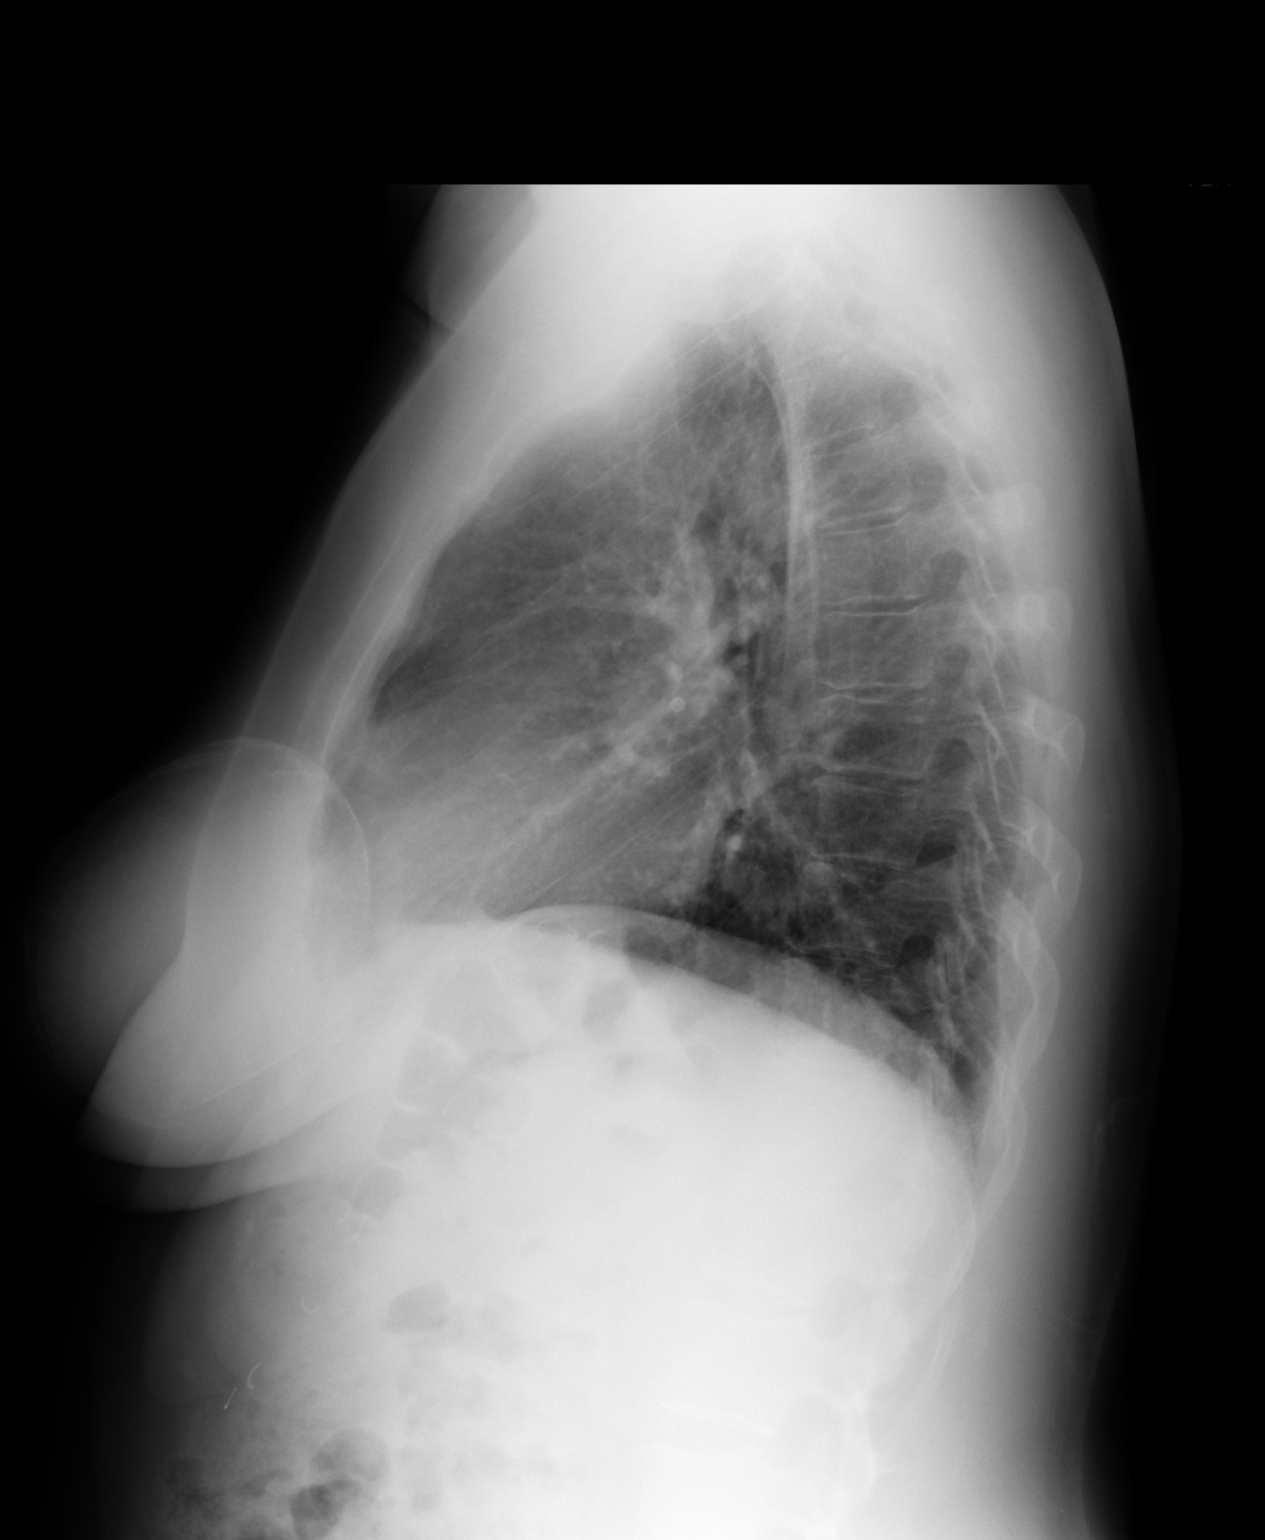

[2 of 2 positions shown; findings below may reference images not displayed]

PROCEDURE:     DXR - DXR CHEST PA (OR AP) AND LATERAL  - September 09, 2008  [DATE]

RESULT:     The lung fields are clear. No pneumonia, pneumothorax or pleural
effusion is seen. The heart, mediastinal and osseous structures show no
significant abnormalities. A mammary implant is present on the RIGHT.
IMPRESSION: No acute changes are identified.

## 2009-12-06 IMAGING — CR DG FOOT COMPLETE 3+V*L*
1 series · 3 of 3 positions shown · non-contrast
Comparison: none

REASON FOR EXAM: pain and injury
COMMENTS:

PROCEDURE:     DXR - DXR FOOT LT COMP W/OBLIQUES  - December 02, 2008  [DATE]
RESULT:     No fracture, dislocation or other acute bony abnormality is
identified. In the lateral view there is incidentally noted a tiny plantar
calcaneal spur.

[Series 1: view not recorded · 0.17mm/px · 3 of 3 slices shown]
[im 1/3]
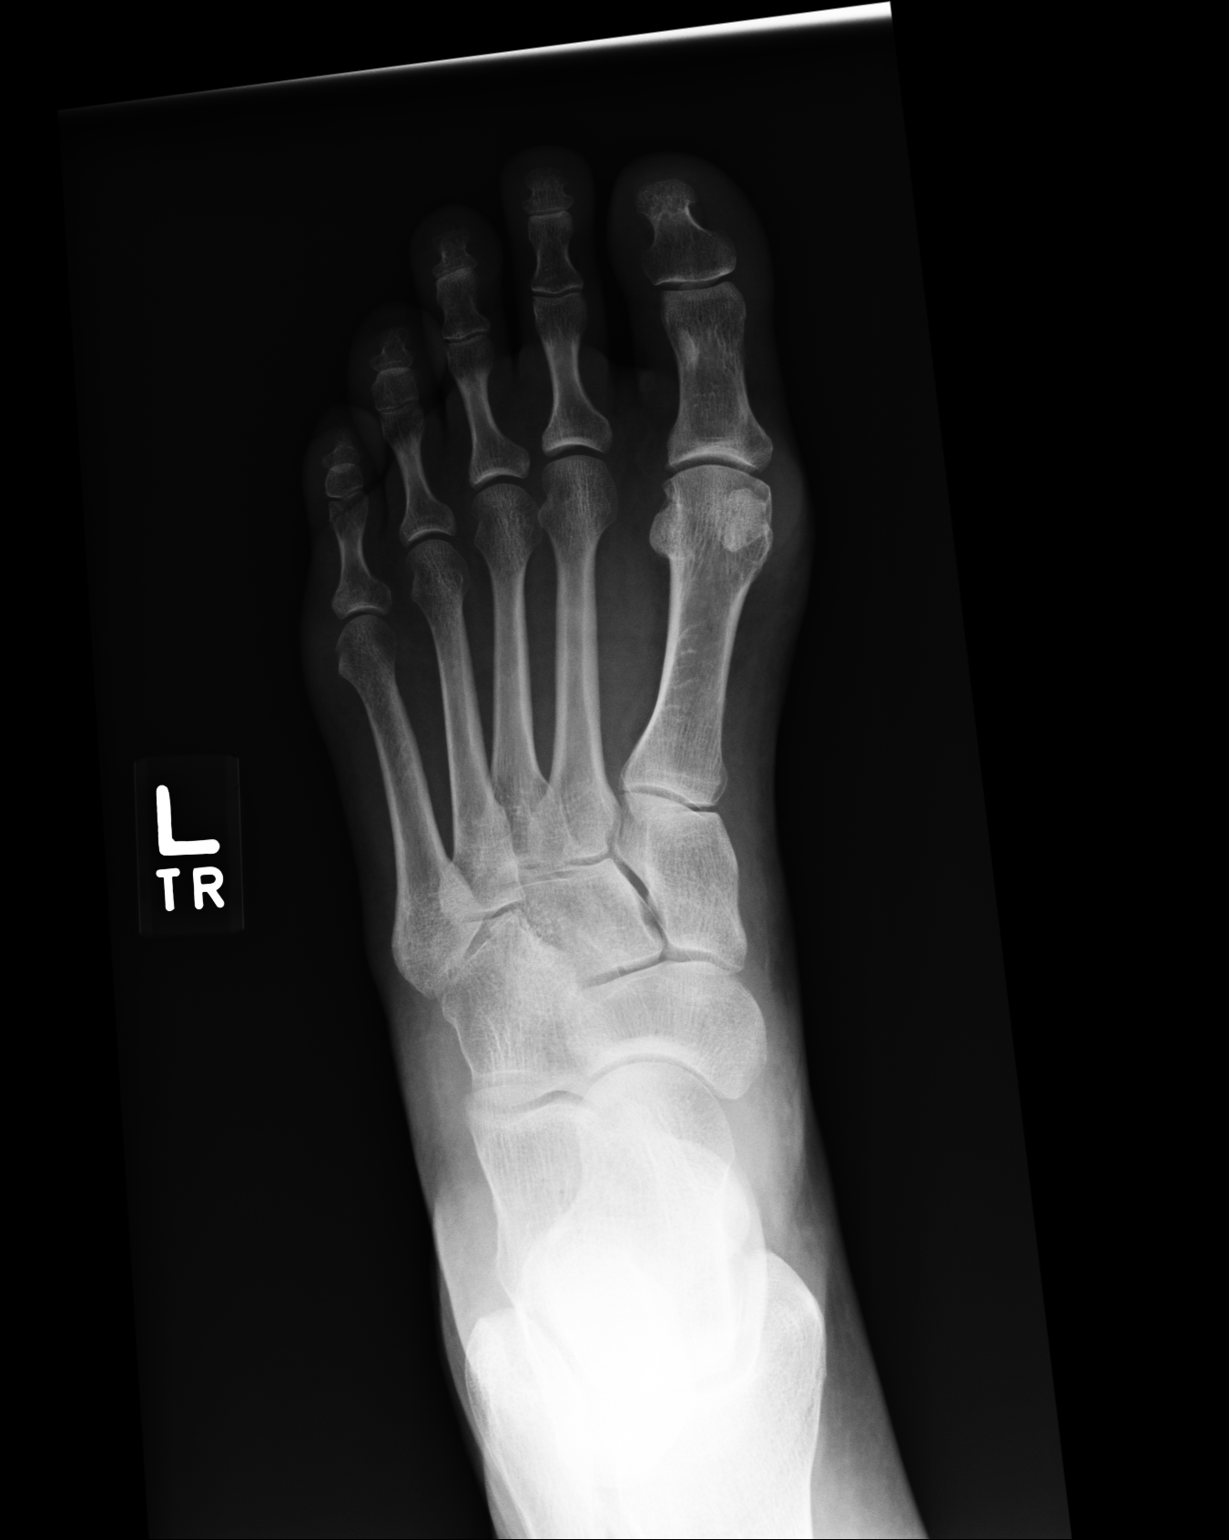
[im 2/3]
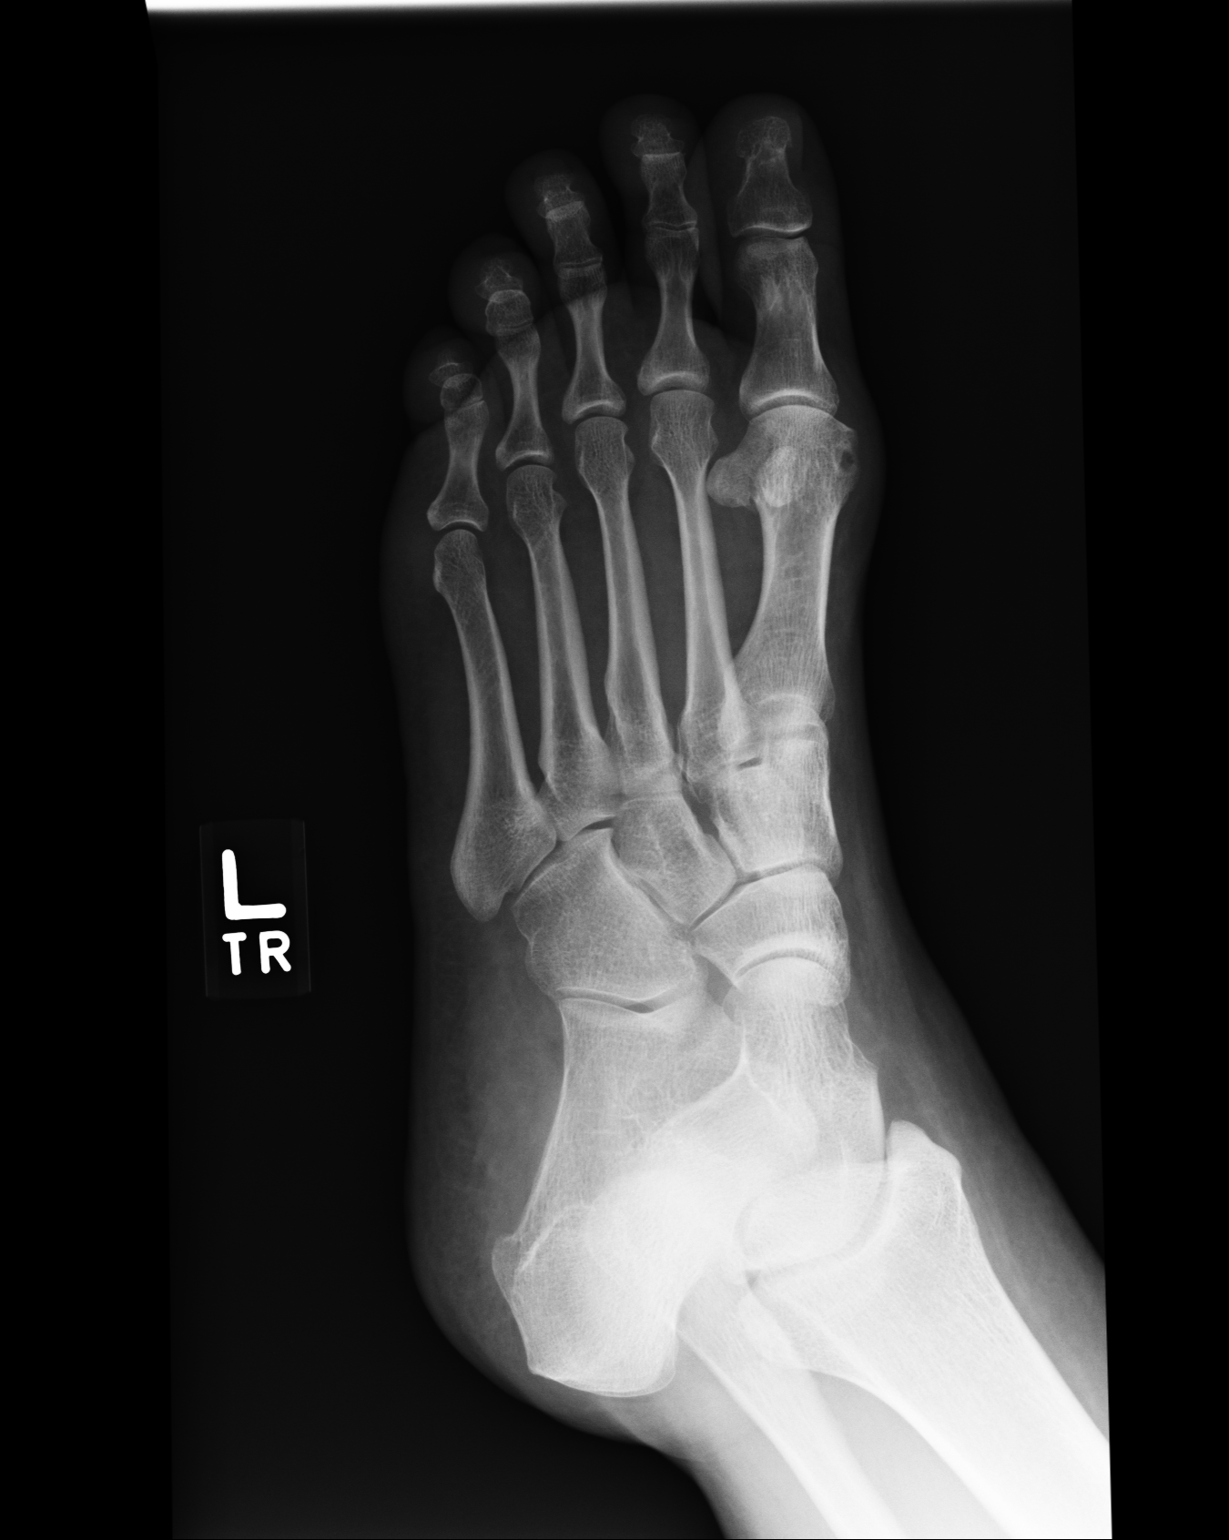
[im 3/3]
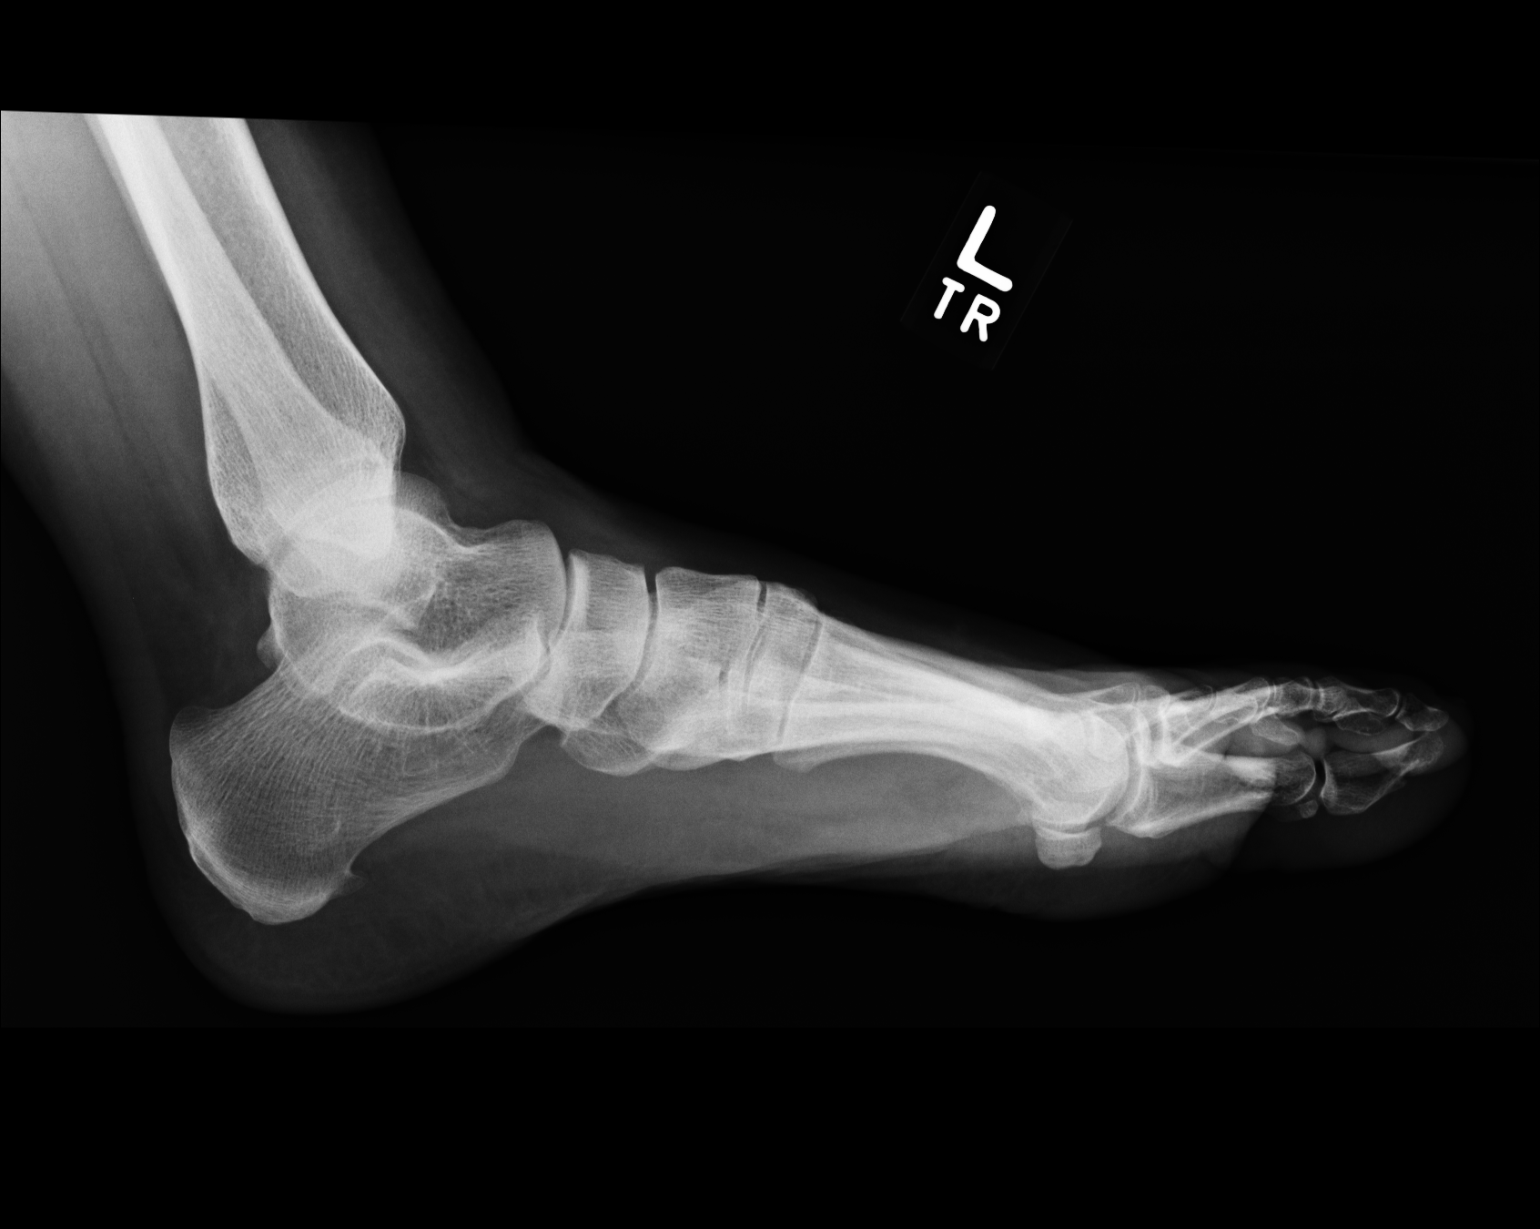

[3 of 3 positions shown; findings below may reference images not displayed]

IMPRESSION: 1. No acute bony abnormalities are identified.
2. There is a tiny plantar calcaneal spur.

## 2011-03-23 ENCOUNTER — Emergency Department: Payer: Self-pay | Admitting: *Deleted

## 2012-03-26 IMAGING — CT CT HEAD WITHOUT CONTRAST
2 series · 16 of 30 positions shown, 20 images · non-contrast
Comparison: none

REASON FOR EXAM: severe  L SIDED headache
COMMENTS:   LMP: Post Hysterectomy

PROCEDURE:     CT  - CT HEAD WITHOUT CONTRAST  - March 23, 2011  [DATE]
RESULT:     Comparison:  07/16/2008
TECHNIQUE: Multiple axial images from the foramen magnum to the vertex were
obtained without IV contrast.

[Series 2: without · axial · non-contrast · 0.42mm/px · z∈[-162,-32]mm · 13 of 32 slices shown, 17 images]
[im 3/32  brain]
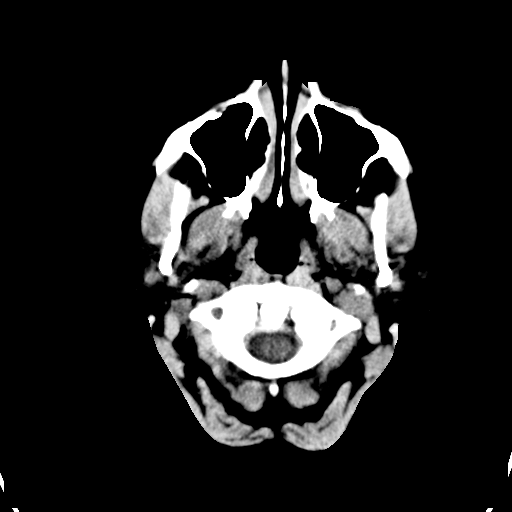
[im 3/32  bone]
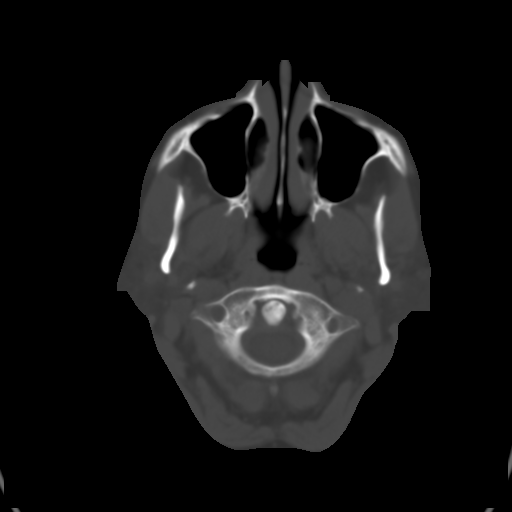
[im 5/32  brain]
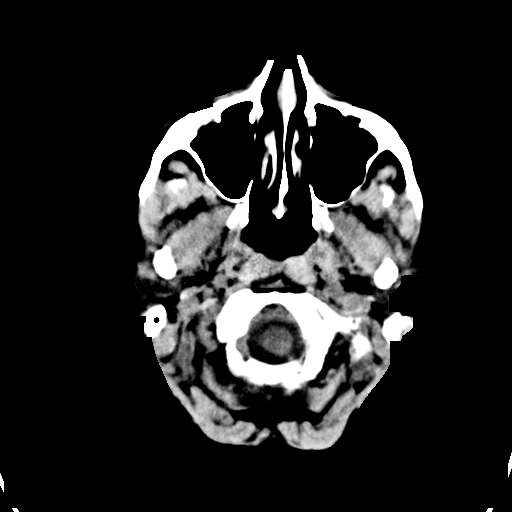
[im 7/32  brain]
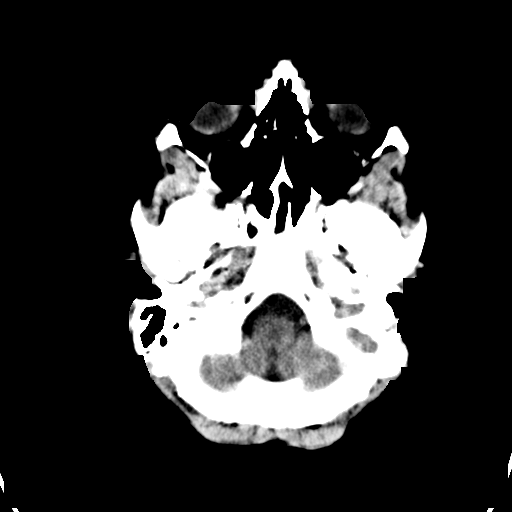
[im 9/32  brain]
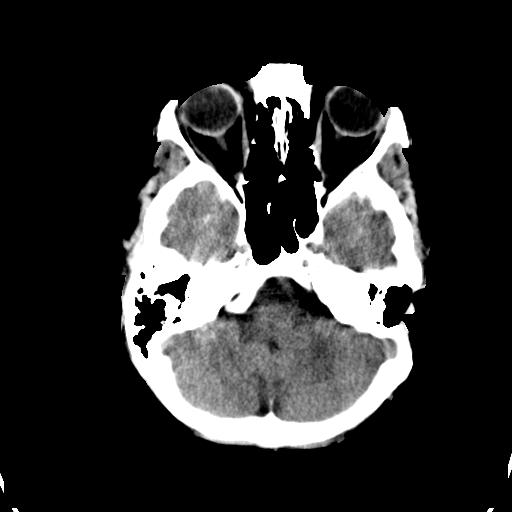
[im 12/32  brain]
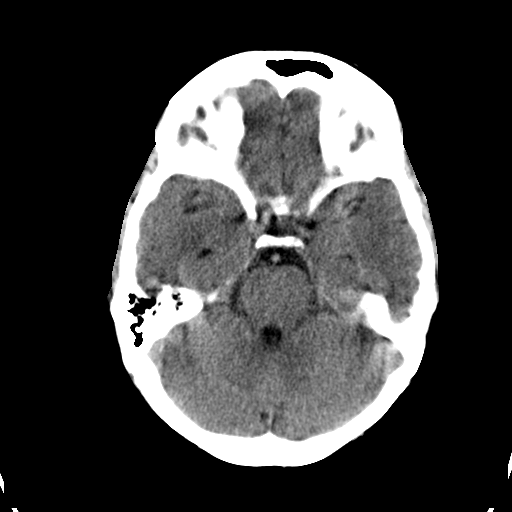
[im 12/32  bone]
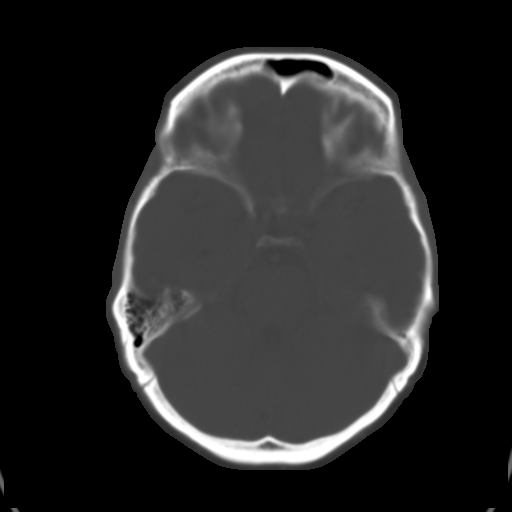
[im 14/32  brain]
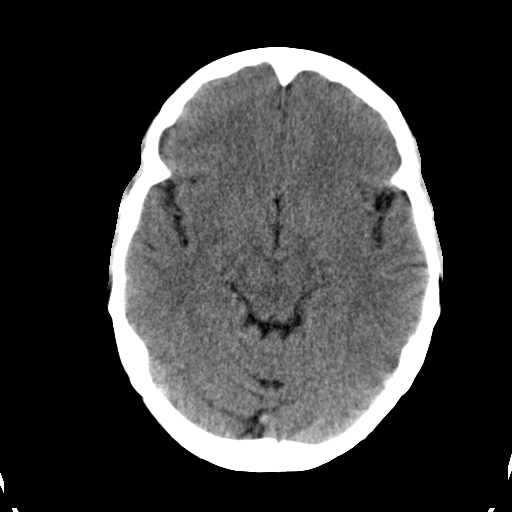
[im 16/32  brain]
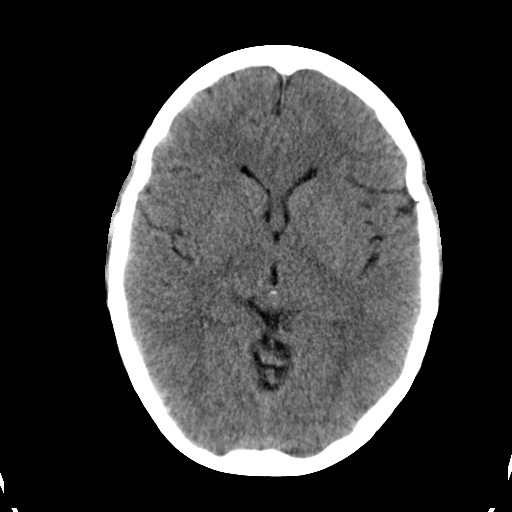
[im 18/32  brain]
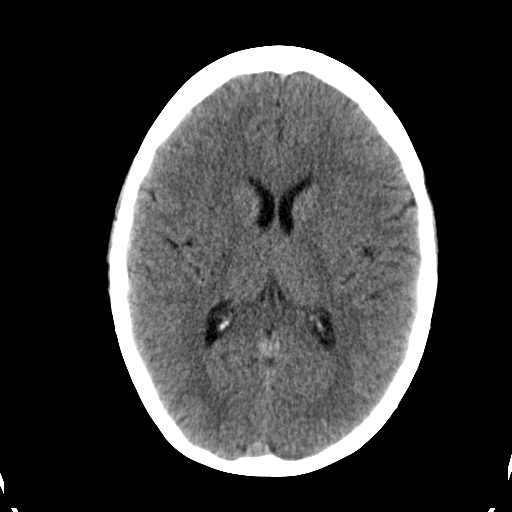
[im 20/32  brain]
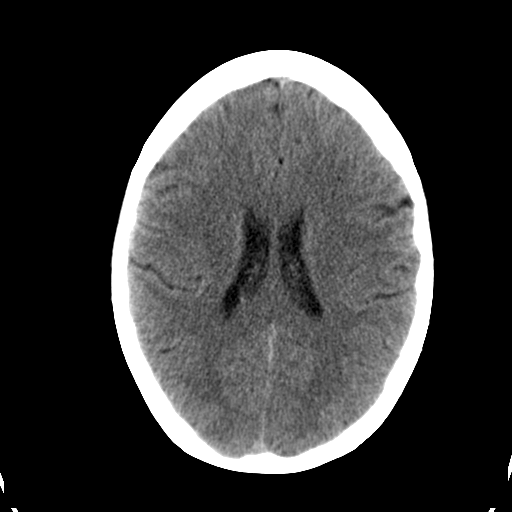
[im 20/32  bone]
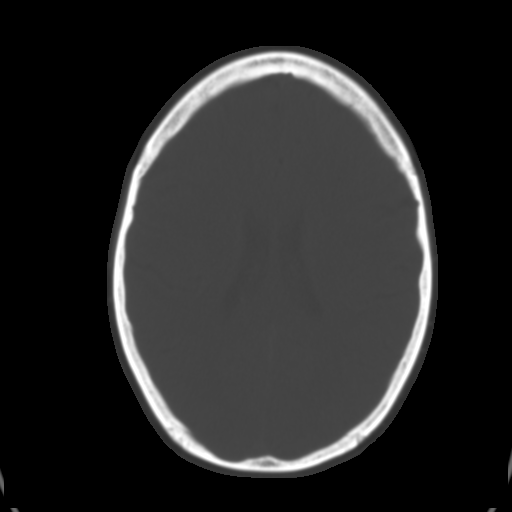
[im 23/32  brain]
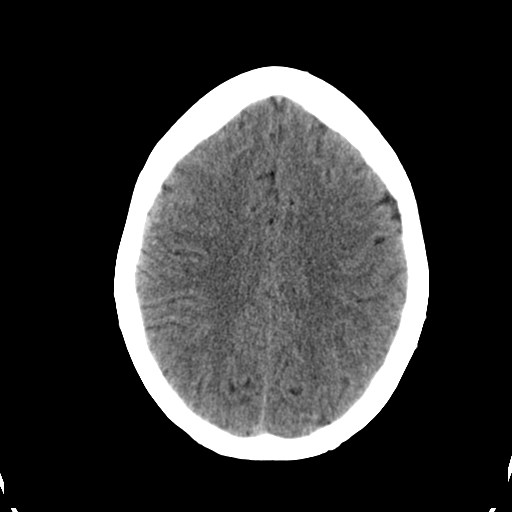
[im 25/32  brain]
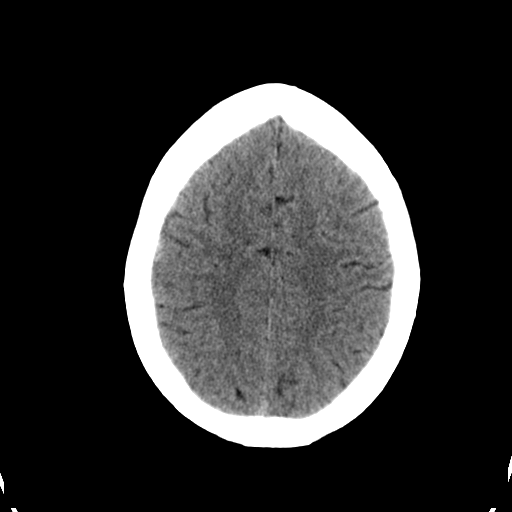
[im 27/32  brain]
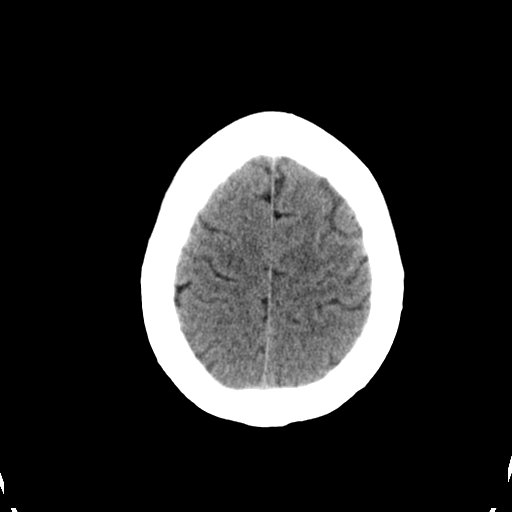
[im 29/32  brain]
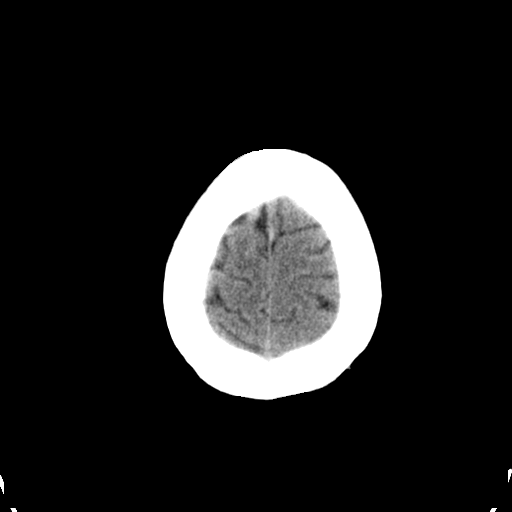
[im 29/32  bone]
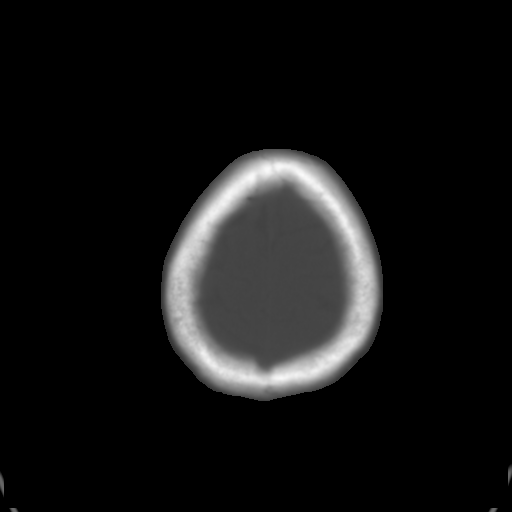

[Series 3: bone · axial · 0.42mm/px · z∈[-162,-117]mm · 3 of 32 slices shown]
[im 3/32  bone]
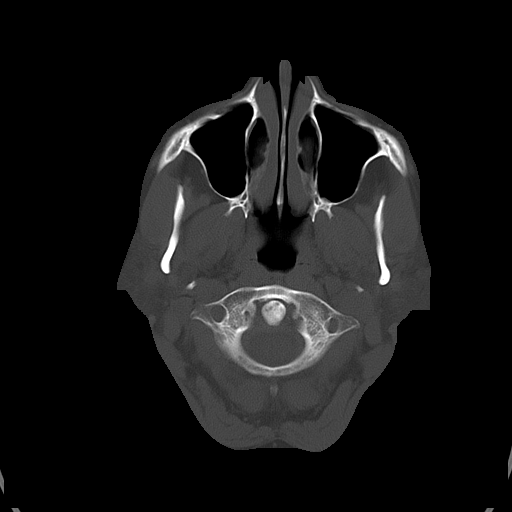
[im 7/32  bone]
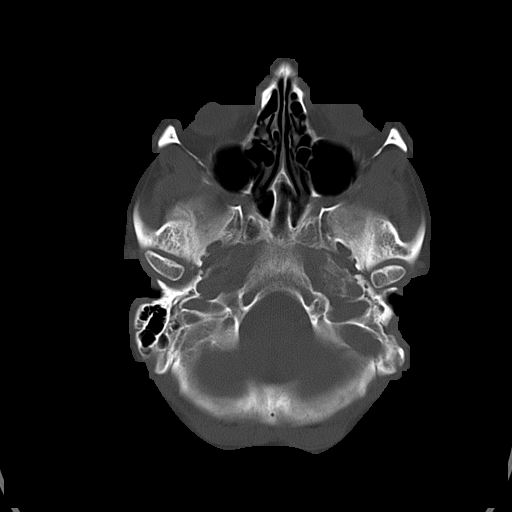
[im 12/32  bone]
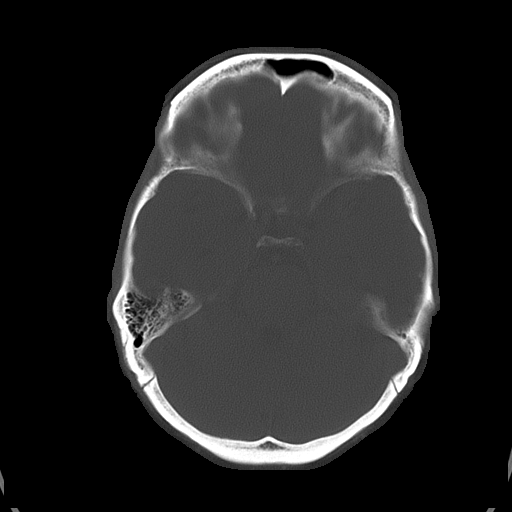

[16 of 30 positions shown; findings below may reference images not displayed]

FINDINGS: There is no evidence for mass effect, midline shift, or extra-axial fluid
collections. There is no evidence for space-occupying lesion, intracranial
hemorrhage, or cortical-based area of infarction.

There are postsurgical changes in the left mastoid air cells, as seen on the
prior study.
IMPRESSION: No acute intracranial process.

## 2016-03-09 ENCOUNTER — Encounter: Payer: Self-pay | Admitting: Emergency Medicine

## 2016-03-09 ENCOUNTER — Emergency Department: Payer: Medicare PPO

## 2016-03-09 ENCOUNTER — Emergency Department
Admission: EM | Admit: 2016-03-09 | Discharge: 2016-03-09 | Disposition: A | Discharge status: Home / Self Care | Payer: Medicare PPO | Attending: Student in an Organized Health Care Education/Training Program | Admitting: Student in an Organized Health Care Education/Training Program

## 2016-03-09 DIAGNOSIS — I10 Essential (primary) hypertension: Secondary | ICD-10-CM | POA: Insufficient documentation

## 2016-03-09 DIAGNOSIS — M545 Low back pain, unspecified: Secondary | ICD-10-CM

## 2016-03-09 DIAGNOSIS — R109 Unspecified abdominal pain: Secondary | ICD-10-CM | POA: Insufficient documentation

## 2016-03-09 HISTORY — DX: Essential (primary) hypertension: I10

## 2016-03-09 HISTORY — DX: Malignant neoplasm of unspecified ovary: C56.9

## 2016-03-09 HISTORY — DX: Reserved for concepts with insufficient information to code with codable children: IMO0002

## 2016-03-09 HISTORY — DX: Disorder of thyroid, unspecified: E07.9

## 2016-03-09 HISTORY — DX: Malignant (primary) neoplasm, unspecified: C80.1

## 2016-03-09 HISTORY — DX: Unspecified osteoarthritis, unspecified site: M19.90

## 2016-03-09 HISTORY — DX: Systemic lupus erythematosus, unspecified: M32.9

## 2016-03-09 HISTORY — DX: Pure hypercholesterolemia, unspecified: E78.00

## 2016-03-09 HISTORY — DX: Disorder of kidney and ureter, unspecified: N28.9

## 2016-03-09 LAB — BASIC METABOLIC PANEL
Anion gap: 8 (ref 5–15)
BUN: 16 mg/dL (ref 6–20)
CALCIUM: 9.3 mg/dL (ref 8.9–10.3)
CHLORIDE: 98 mmol/L — AB (ref 101–111)
CO2: 32 mmol/L (ref 22–32)
CREATININE: 0.98 mg/dL (ref 0.44–1.00)
GFR calc Af Amer: >60 mL/min (ref >60)
GFR calc non Af Amer: >60 mL/min (ref >60)
Glucose, Bld: 92 mg/dL (ref 65–99)
Potassium: 3.6 mmol/L (ref 3.5–5.1)
SODIUM: 138 mmol/L (ref 135–145)

## 2016-03-09 LAB — CBC
HCT: 42 % (ref 35.0–47.0)
Hemoglobin: 14.4 g/dL (ref 12.0–16.0)
MCH: 27.3 pg (ref 26.0–34.0)
MCHC: 34.3 g/dL (ref 32.0–36.0)
MCV: 79.6 fL — AB (ref 80.0–100.0)
Platelets: 246 10*3/uL (ref 150–440)
RBC: 5.28 MIL/uL — ABNORMAL HIGH (ref 3.80–5.20)
RDW: 16 % — ABNORMAL HIGH (ref 11.5–14.5)
WBC: 7 10*3/uL (ref 3.6–11.0)

## 2016-03-09 LAB — URINALYSIS COMPLETE WITH MICROSCOPIC (ARMC ONLY)
Bilirubin Urine: NEGATIVE
GLUCOSE, UA: NEGATIVE mg/dL
HGB URINE DIPSTICK: NEGATIVE
Ketones, ur: NEGATIVE mg/dL
LEUKOCYTES UA: NEGATIVE
NITRITE: NEGATIVE
PH: 6 (ref 5.0–8.0)
Protein, ur: NEGATIVE mg/dL
SPECIFIC GRAVITY, URINE: 1.012 (ref 1.005–1.030)

## 2016-03-09 MED ORDER — HALOPERIDOL LACTATE 5 MG/ML IJ SOLN
2.0000 mg | Freq: Once | INTRAMUSCULAR | Status: AC
  Administered 2016-03-09: 2 mg via INTRAVENOUS
  Filled 2016-03-09: qty 1

## 2016-03-09 MED ORDER — HYDROMORPHONE HCL 1 MG/ML IJ SOLN
1.0000 mg | INTRAMUSCULAR | Status: DC | PRN
  Administered 2016-03-09: 1 mg via INTRAVENOUS
  Filled 2016-03-09: qty 1

## 2016-03-09 MED ORDER — ACETAMINOPHEN 500 MG PO TABS
1000.0000 mg | ORAL_TABLET | Freq: Once | ORAL | Status: AC
  Administered 2016-03-09: 1000 mg via ORAL
  Filled 2016-03-09: qty 2

## 2016-03-09 MED ORDER — KETOROLAC TROMETHAMINE 30 MG/ML IJ SOLN
15.0000 mg | Freq: Once | INTRAMUSCULAR | Status: AC
  Administered 2016-03-09: 15 mg via INTRAVENOUS
  Filled 2016-03-09: qty 1

## 2016-03-09 MED ORDER — SODIUM CHLORIDE 0.9 % IV BOLUS (SEPSIS)
1000.0000 mL | Freq: Once | INTRAVENOUS | Status: AC
  Administered 2016-03-09: 1000 mL via INTRAVENOUS

## 2016-03-09 MED ORDER — DIAZEPAM 5 MG PO TABS
5.0000 mg | ORAL_TABLET | Freq: Once | ORAL | Status: AC
  Administered 2016-03-09: 5 mg via ORAL
  Filled 2016-03-09: qty 1

## 2016-03-09 MED ORDER — NAPROXEN 500 MG PO TABS
500.0000 mg | ORAL_TABLET | Freq: Two times a day (BID) | ORAL | 0 refills | Status: AC
Start: 2016-03-09 — End: 2017-03-09

## 2016-03-09 MED ORDER — DIAZEPAM 5 MG PO TABS
5.0000 mg | ORAL_TABLET | Freq: Every day | ORAL | 0 refills | Status: AC
Start: 2016-03-09 — End: 2017-03-09

## 2016-03-09 NOTE — ED Notes (Signed)
Patient transported to CT 

## 2016-03-09 NOTE — ED Triage Notes (Signed)
Patient presents to the ED with left flank pain x 4 days.  Patient states pain is worse when sitting, patient appears uncomfortable.  Patient reports history of left sided kidney stones.  Patient denies dysuria and frequency.  Patient denies vomiting, reports some nausea.

## 2016-03-09 NOTE — ED Provider Notes (Signed)
[facility] Emergency Department Provider Note    First MD Initiated Contact with Patient 03/09/16 1630     (approximate)  I have reviewed the triage vital signs and the nursing notes.   HISTORY  Chief Complaint Flank Pain    HPI Maria Potter is a 55 y.o. female presents with 4 days of severe left lower flank pain. Patient states she has a history of stones. States that this is worse than her previous kidney stones.  Currently rates the pain as 10 out of 10 in severity. Rates it as a crampy aching pain in the left flank. Denies any fevers. Maria Potter that she had an episode of nausea yesterday. Denies any diarrhea or constipation. Denies any abdominal pain. Denies any recent falls, numbness or tingling.    Past Medical History:  Diagnosis Date  . Arthritis   . Cancer ([place])   . High cholesterol   . Hypertension   . 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 Ovarian cancer (Maria Potter)   . Renal disorder   . Thyroid disease     There are no active problems to display for this patient.   Past Surgical History:  Procedure Laterality Date  . EXTERNAL EAR SURGERY     x 21    Prior to Admission medications   Medication Sig Start Date End Date Taking? Authorizing Provider  diazepam (VALIUM) 5 MG tablet Take 1 tablet (5 mg total) by mouth at bedtime. 03/09/16 03/09/17  Merlyn Lot, MD  naproxen (NAPROSYN) 500 MG tablet Take 1 tablet (500 mg total) by mouth 2 (two) times daily with a meal. 03/09/16 03/09/17  Merlyn Lot, MD    Allergies Morphine and related and Codeine  No family history on file.  Social History Social History  Substance Use Topics  . Smoking status: Never Smoker  . Smokeless tobacco: Never Used  . Alcohol use No    Review of Systems Patient denies headaches, rhinorrhea, blurry vision, numbness, shortness of breath, chest pain, edema, cough, abdominal pain, nausea, vomiting, diarrhea, dysuria, fevers, rashes or hallucinations  unless otherwise stated above in HPI. ____________________________________________   PHYSICAL EXAM:  VITAL SIGNS: Vitals:   03/09/16 1830 03/09/16 1900  BP: 124/69 117/63  Pulse: 60 64  Resp:    Temp:      Constitutional: Alert and oriented. uncomfortable appearing Eyes: Conjunctivae are normal. PERRL. EOMI. Head: Atraumatic. Nose: No congestion/rhinnorhea. Mouth/Throat: Mucous membranes are moist.  Oropharynx non-erythematous. Neck: No stridor. Painless ROM. No cervical spine tenderness to palpation Hematological/Lymphatic/Immunilogical: No cervical lymphadenopathy. Cardiovascular: Normal rate, regular rhythm. Grossly normal heart sounds.  Good peripheral circulation. Respiratory: Normal respiratory effort.  No retractions. Lungs CTAB. Gastrointestinal: Soft and nontender. No distention. No abdominal bruits. Left CVA tenderness. Musculoskeletal: No lower extremity tenderness nor edema.  TTP superior to left SI joint, no midline L spine TTP.  No joint effusions. Neurologic:  Normal speech and language. No gross focal neurologic deficits are appreciated. No gait instability. Skin:  Skin is warm, dry and intact. No rash noted. Psychiatric: Mood and affect are normal. Speech and behavior are normal.  ____________________________________________   LABS (all labs ordered are listed, but only abnormal results are displayed)  Results for orders placed or performed during the hospital encounter of 03/09/16 (from the past 24 hour(s))  Urinalysis complete, with microscopic- may I&O cath if menses     Status: Abnormal   Collection Time: 03/09/16  3:13 PM  Result Value Ref Range  Color, Urine YELLOW (A) YELLOW   APPearance CLEAR (A) CLEAR   Glucose, UA NEGATIVE NEGATIVE mg/dL   Bilirubin Urine NEGATIVE NEGATIVE   Ketones, ur NEGATIVE NEGATIVE mg/dL   Specific Gravity, Urine 1.012 1.005 - 1.030   Hgb urine dipstick NEGATIVE NEGATIVE   pH 6.0 5.0 - 8.0   Protein, ur NEGATIVE  NEGATIVE mg/dL   Nitrite NEGATIVE NEGATIVE   Leukocytes, UA NEGATIVE NEGATIVE   RBC / HPF 0-5 0 - 5 RBC/hpf   WBC, UA 0-5 0 - 5 WBC/hpf   Bacteria, UA RARE (A) NONE SEEN   Squamous Epithelial / LPF 0-5 (A) NONE SEEN  Basic metabolic panel     Status: Abnormal   Collection Time: 03/09/16  3:13 PM  Result Value Ref Range   Sodium 138 135 - 145 mmol/L   Potassium 3.6 3.5 - 5.1 mmol/L   Chloride 98 (L) 101 - 111 mmol/L   CO2 32 22 - 32 mmol/L   Glucose, Bld 92 65 - 99 mg/dL   BUN 16 6 - 20 mg/dL   Creatinine, Ser 0.98 0.44 - 1.00 mg/dL   Calcium 9.3 8.9 - 10.3 mg/dL   GFR calc non Af Amer >60 >60 mL/min   GFR calc Af Amer >60 >60 mL/min   Anion gap 8 5 - 15  CBC     Status: Abnormal   Collection Time: 03/09/16  3:13 PM  Result Value Ref Range   WBC 7.0 3.6 - 11.0 K/uL   RBC 5.28 (H) 3.80 - 5.20 MIL/uL   Hemoglobin 14.4 12.0 - 16.0 g/dL   HCT 42.0 35.0 - 47.0 %   MCV 79.6 (L) 80.0 - 100.0 fL   MCH 27.3 26.0 - 34.0 pg   MCHC 34.3 32.0 - 36.0 g/dL   RDW 16.0 (H) 11.5 - 14.5 %   Platelets 246 150 - 440 K/uL   ____________________________________________ ____________________________________________  RADIOLOGY  CT abd/pelvis without evidence of fracture, colitis or stone. ____________________________________________   PROCEDURES  Procedure(s) performed: none    Critical Care performed: no ____________________________________________   INITIAL IMPRESSION / ASSESSMENT AND PLAN / ED COURSE  Pertinent labs & imaging results that were available during my care of the patient were reviewed by me and considered in my medical decision making (see chart for details).  DDX: stone, mass, fracture, msk spasm, sacroiliitis, colitis, pyelo  Maria Potter is a 55 y.o. who presents to the ED with 4 days of acute and severe left flank pain.  Patient is afebrile but does appear very uncomfortable on exam. Her abdominal exam is soft and benign. She has no rash to suggest shingles. She does  have left CVA tenderness. Laboratory evaluation ordered in triage due to concern for the above complaints shows normal renal function and no leukocytosis. Her urine is unremarkable. Based on her pain and history do feel that CT imaging is clinically indicated due to concern for obstructing kidney stone.   She has no red flags to suggest epidural abscess, spinal stenosis or cauda equina. She is afebrile and presentation is not consistent with pyelonephritis. We'll provide IV pain medication to try to improve her symptoms.  The patient will be placed on continuous pulse oximetry and telemetry for monitoring.  Laboratory evaluation will be sent to evaluate for the above complaints.     Clinical Course  Comment By Time  Patient reassessed and states pain improved. She has no leukocytosis and her blood work is otherwise unremarkable. Urinalysis is without signs of  infection or hematuria. Discussed results of CT imaging with patient based on presentation feel this is more consistent with acute muscle skeletal back pain. She has no neuro deficits at this time. No evidence of sciatica.   Merlyn Lot, MD 08/24 [phone]    Patient was able to tolerate PO and was able to ambulate with a steady gait.  Pain improved.  Have discussed with the patient and available family all diagnostics and treatments performed thus far and all questions were answered to the best of my ability. The patient demonstrates understanding and agreement with plan.   ____________________________________________   FINAL CLINICAL IMPRESSION(S) / ED DIAGNOSES  Final diagnoses:  Left flank pain  Left-sided low back pain without sciatica      NEW MEDICATIONS STARTED DURING THIS VISIT:  Discharge Medication List as of 03/09/2016  6:48 PM    START taking these medications   Details  diazepam (VALIUM) 5 MG tablet Take 1 tablet (5 mg total) by mouth at bedtime., Starting Thu 03/09/2016, Until Fri 03/09/2017, Print    naproxen  (NAPROSYN) 500 MG tablet Take 1 tablet (500 mg total) by mouth 2 (two) times daily with a meal., Starting Thu 03/09/2016, Until Fri 03/09/2017, Print         Note:  This document was prepared using Dragon voice recognition software and may include unintentional dictation errors.    Merlyn Lot, MD 03/09/16 2111

## 2017-03-13 IMAGING — CT CT RENAL STONE PROTOCOL
2 of 4 series · 16 of 46 positions shown, 18 images · non-contrast
Comparison: 10/20/2005

CLINICAL DATA: Left flank pain x4 days, history of kidney stones

EXAM:
CT ABDOMEN AND PELVIS WITHOUT CONTRAST
TECHNIQUE: Multidetector CT imaging of the abdomen and pelvis was performed
following the standard protocol without IV contrast.

[Series 2: axial st · axial · 0.77mm/px · z∈[-461,-31]mm · 13 of 94 slices shown, 15 images]
[im 4/94  soft-tissue]
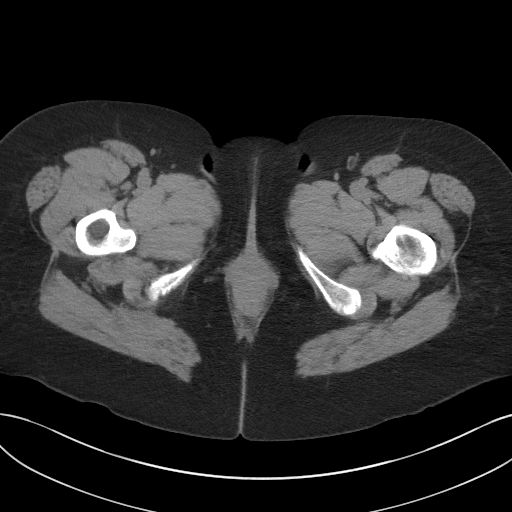
[im 4/94  bone]
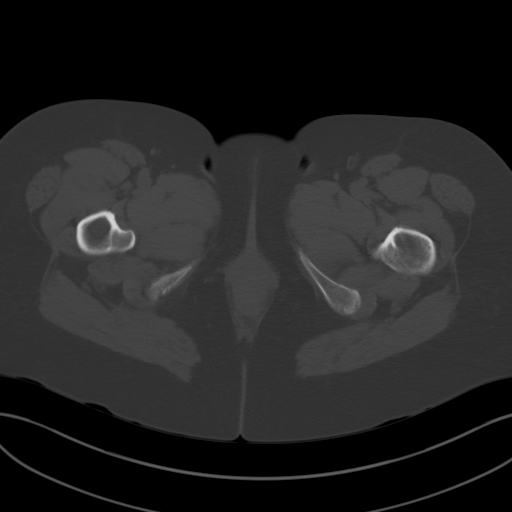
[im 12/94  soft-tissue]
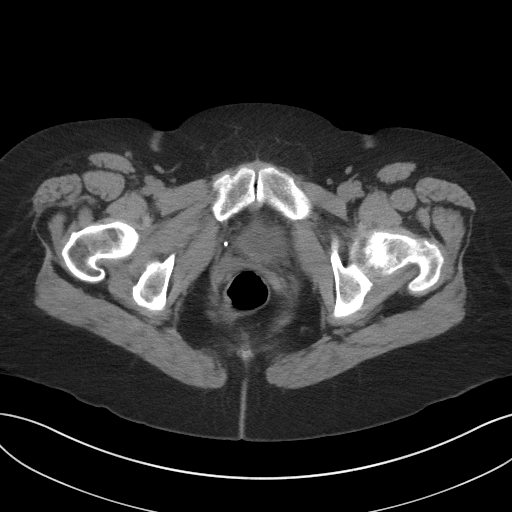
[im 19/94  soft-tissue]
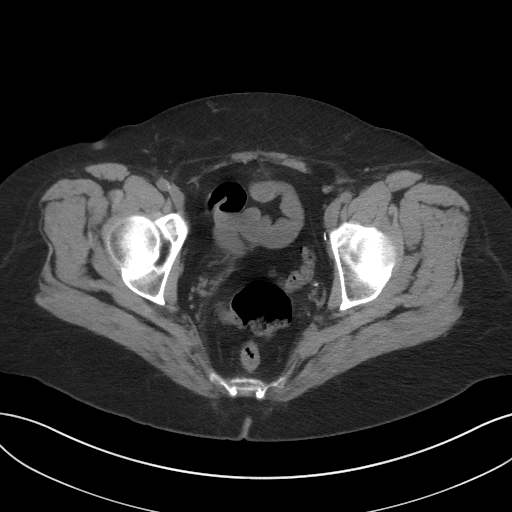
[im 27/94  soft-tissue]
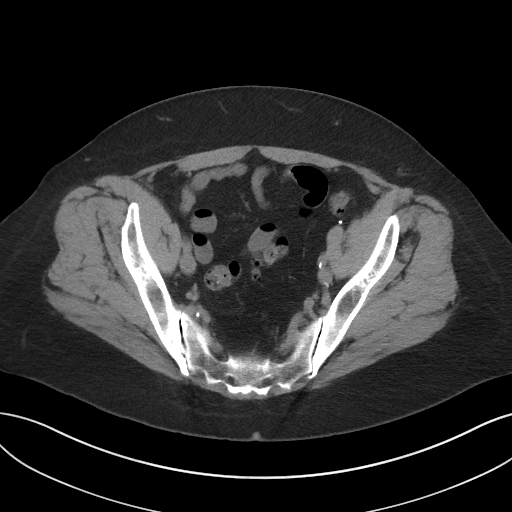
[im 34/94  soft-tissue]
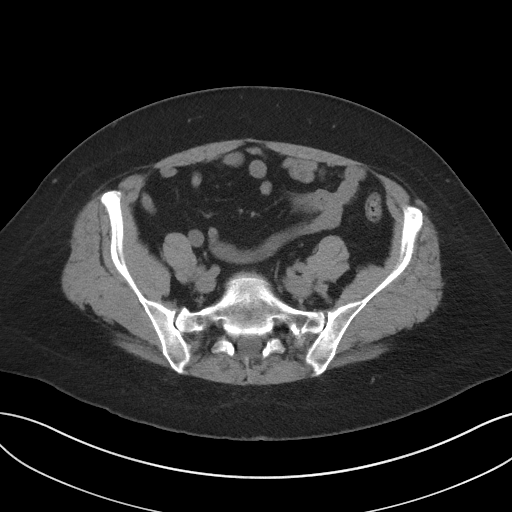
[im 41/94  soft-tissue]
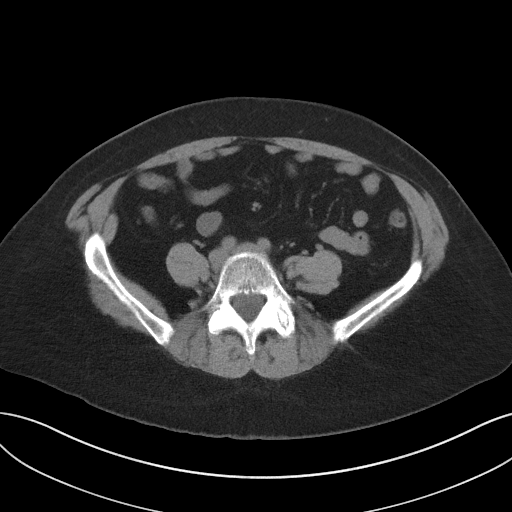
[im 49/94  soft-tissue]
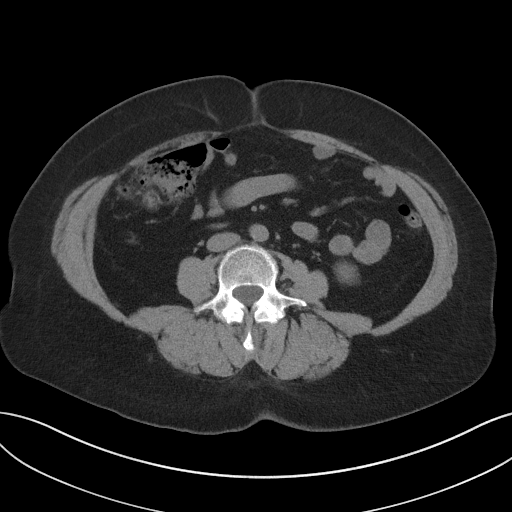
[im 53/94  soft-tissue]
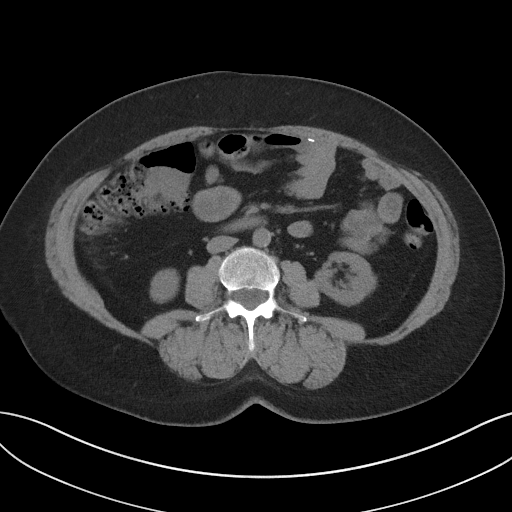
[im 60/94  soft-tissue]
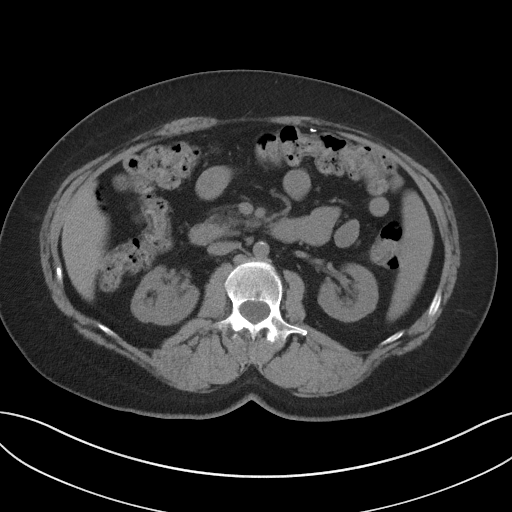
[im 60/94  bone]
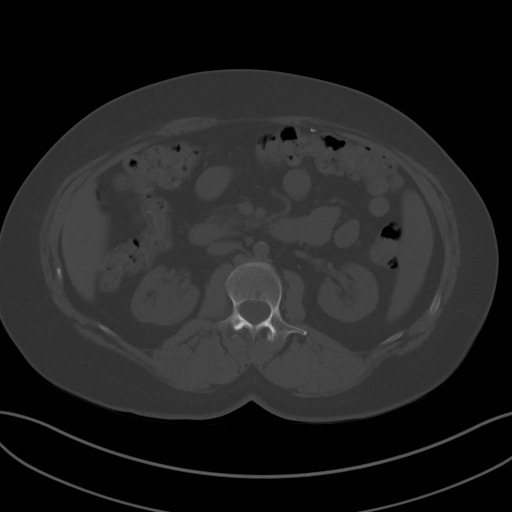
[im 67/94  soft-tissue]
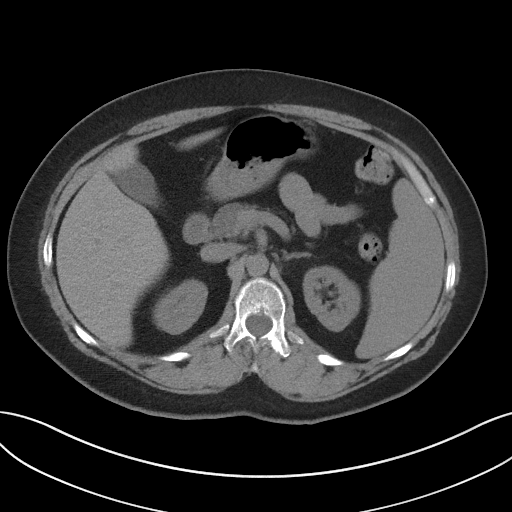
[im 75/94  soft-tissue]
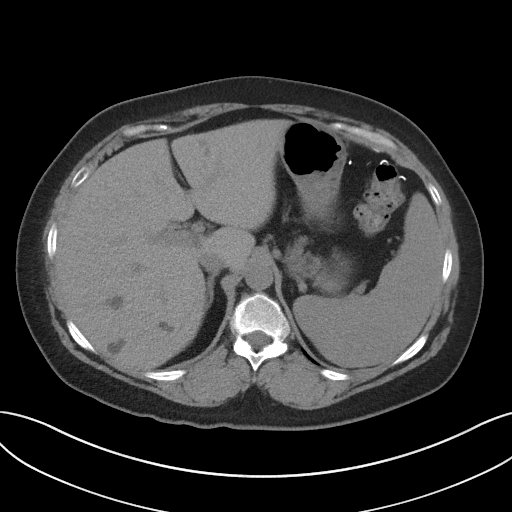
[im 82/94  soft-tissue]
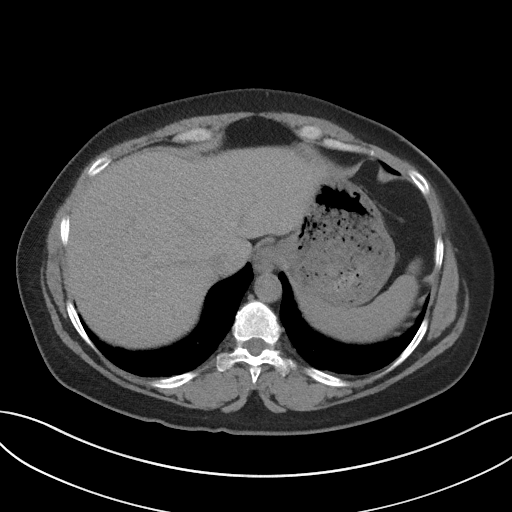
[im 90/94  soft-tissue]
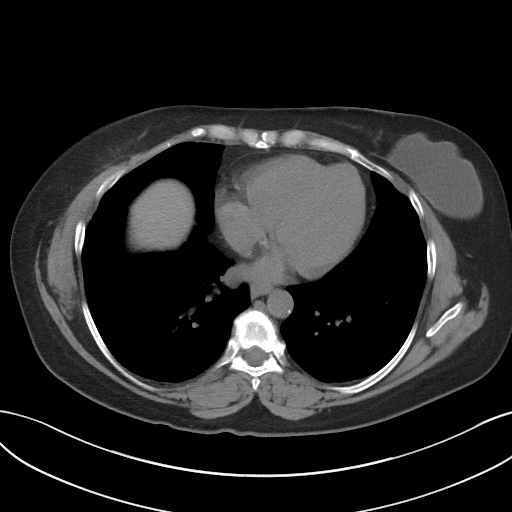

[Series 4: coronal st · coronal · 0.73mm/px · 3 of 94 slices shown]
[im 32/94  soft-tissue]
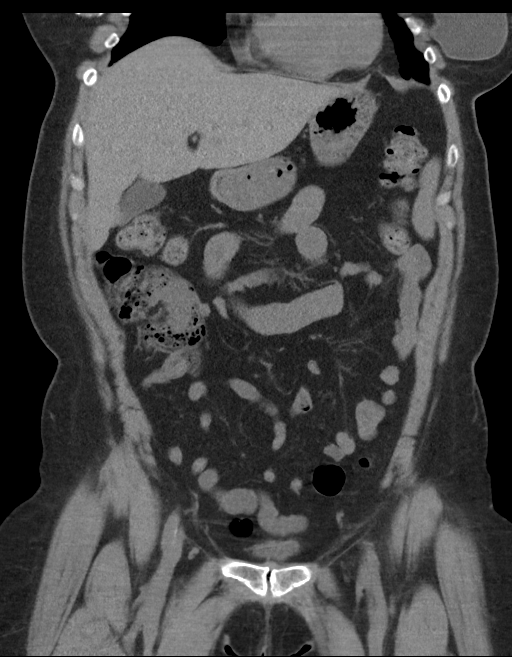
[im 42/94  soft-tissue]
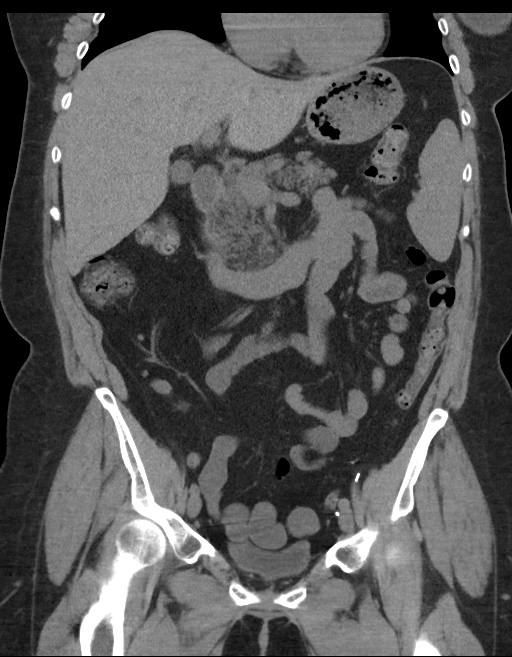
[im 52/94  soft-tissue]
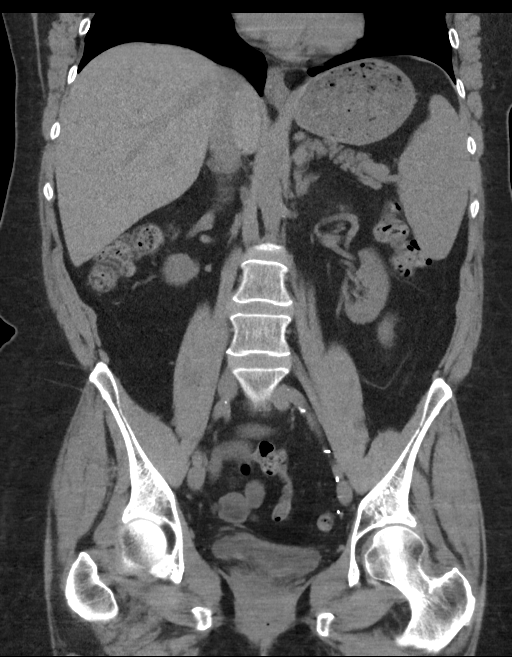

[16 of 46 positions shown; findings below may reference images not displayed]

FINDINGS: Lower chest:  Lung bases are clear.

Bilateral breast augmentation, incompletely visualized.

Hepatobiliary: Scattered hepatic cysts measuring up to 1.5 cm
(series 2/image 20).

Gallbladder is unremarkable. No intrahepatic or extrahepatic ductal
dilatation.

Pancreas: Within normal limits.

Spleen: At the upper limits of normal for size.

Adrenals/Urinary Tract: Adrenal glands within normal limits.

1.8 cm cyst in the medial left lower kidney (series 2/ image 38).
Right kidney is within normal limits.

No ureteral or bladder calculi.  No hydronephrosis.

Bladder is within normal limits.

Stomach/Bowel: Stomach is within normal limits.

No evidence of bowel obstruction.

Appendix is not discretely visualized.

Left colon is decompressed.

Vascular/Lymphatic: No evidence of abdominal aortic aneurysm.

Atherosclerotic calcifications of the abdominal aorta and branch
vessels.

No suspicious abdominopelvic lymphadenopathy.

Reproductive: Status post hysterectomy.

No adnexal masses.

Other: No abdominopelvic ascites.

Musculoskeletal: Visualized osseous structures are within normal
limits.
IMPRESSION: No renal, ureteral, or bladder calculi.  No hydronephrosis.

No evidence of bowel obstruction.

No CT findings to account for the patient's left flank pain.

## 2020-04-02 ENCOUNTER — Emergency Department: Payer: Medicare PPO

## 2020-04-02 ENCOUNTER — Emergency Department
Admission: EM | Admit: 2020-04-02 | Discharge: 2020-04-03 | Disposition: A | Discharge status: Home / Self Care | Payer: Medicare PPO | Attending: Emergency Medicine | Admitting: Emergency Medicine

## 2020-04-02 DIAGNOSIS — K56609 Unspecified intestinal obstruction, unspecified as to partial versus complete obstruction: Secondary | ICD-10-CM | POA: Diagnosis not present

## 2020-04-02 DIAGNOSIS — I10 Essential (primary) hypertension: Secondary | ICD-10-CM | POA: Diagnosis not present

## 2020-04-02 DIAGNOSIS — R10817 Generalized abdominal tenderness: Secondary | ICD-10-CM | POA: Insufficient documentation

## 2020-04-02 DIAGNOSIS — R531 Weakness: Secondary | ICD-10-CM | POA: Diagnosis not present

## 2020-04-02 DIAGNOSIS — T8549XA Other mechanical complication of breast prosthesis and implant, initial encounter: Secondary | ICD-10-CM | POA: Diagnosis not present

## 2020-04-02 DIAGNOSIS — Z20822 Contact with and (suspected) exposure to covid-19: Secondary | ICD-10-CM | POA: Insufficient documentation

## 2020-04-02 DIAGNOSIS — T8543XA Leakage of breast prosthesis and implant, initial encounter: Secondary | ICD-10-CM

## 2020-04-02 DIAGNOSIS — R109 Unspecified abdominal pain: Secondary | ICD-10-CM | POA: Diagnosis present

## 2020-04-02 DIAGNOSIS — R000 Tachycardia, unspecified: Secondary | ICD-10-CM | POA: Insufficient documentation

## 2020-04-02 DIAGNOSIS — R5383 Other fatigue: Secondary | ICD-10-CM | POA: Insufficient documentation

## 2020-04-02 DIAGNOSIS — R112 Nausea with vomiting, unspecified: Secondary | ICD-10-CM | POA: Insufficient documentation

## 2020-04-02 DIAGNOSIS — C569 Malignant neoplasm of unspecified ovary: Secondary | ICD-10-CM | POA: Diagnosis not present

## 2020-04-02 DIAGNOSIS — R52 Pain, unspecified: Secondary | ICD-10-CM

## 2020-04-02 DIAGNOSIS — Z79899 Other long term (current) drug therapy: Secondary | ICD-10-CM | POA: Diagnosis not present

## 2020-04-02 LAB — CBC WITH DIFFERENTIAL/PLATELET
Abs Immature Granulocytes: 0.04 10*3/uL (ref 0.00–0.07)
Basophils Absolute: 0.1 10*3/uL (ref 0.0–0.1)
Basophils Relative: 1 %
Eosinophils Absolute: 0.4 10*3/uL (ref 0.0–0.5)
Eosinophils Relative: 5 %
HCT: 44.6 % (ref 36.0–46.0)
Hemoglobin: 14.4 g/dL (ref 12.0–15.0)
Immature Granulocytes: 1 %
Lymphocytes Relative: 22 %
Lymphs Abs: 1.9 10*3/uL (ref 0.7–4.0)
MCH: 26.1 pg (ref 26.0–34.0)
MCHC: 32.3 g/dL (ref 30.0–36.0)
MCV: 80.8 fL (ref 80.0–100.0)
Monocytes Absolute: 0.6 10*3/uL (ref 0.1–1.0)
Monocytes Relative: 7 %
Neutro Abs: 5.6 10*3/uL (ref 1.7–7.7)
Neutrophils Relative %: 64 %
Platelets: 297 10*3/uL (ref 150–400)
RBC: 5.52 MIL/uL — ABNORMAL HIGH (ref 3.87–5.11)
RDW: 15.5 % (ref 11.5–15.5)
WBC: 8.5 10*3/uL (ref 4.0–10.5)
nRBC: 0 % (ref 0.0–0.2)

## 2020-04-02 LAB — COMPREHENSIVE METABOLIC PANEL
ALT: 25 U/L (ref 0–44)
AST: 31 U/L (ref 15–41)
Albumin: 4.8 g/dL (ref 3.5–5.0)
Alkaline Phosphatase: 94 U/L (ref 38–126)
Anion gap: 12 (ref 5–15)
BUN: 19 mg/dL (ref 6–20)
CO2: 27 mmol/L (ref 22–32)
Calcium: 9.6 mg/dL (ref 8.9–10.3)
Chloride: 99 mmol/L (ref 98–111)
Creatinine, Ser: 0.88 mg/dL (ref 0.44–1.00)
GFR calc Af Amer: >60 mL/min (ref >60)
GFR calc non Af Amer: >60 mL/min (ref >60)
Glucose, Bld: 118 mg/dL — ABNORMAL HIGH (ref 70–99)
Potassium: 3.8 mmol/L (ref 3.5–5.1)
Sodium: 138 mmol/L (ref 135–145)
Total Bilirubin: 0.9 mg/dL (ref 0.3–1.2)
Total Protein: 8.7 g/dL — ABNORMAL HIGH (ref 6.5–8.1)

## 2020-04-02 LAB — CBC
HCT: 43.1 % (ref 36.0–46.0)
Hemoglobin: 14.6 g/dL (ref 12.0–15.0)
MCH: 26.1 pg (ref 26.0–34.0)
MCHC: 33.9 g/dL (ref 30.0–36.0)
MCV: 77.1 fL — ABNORMAL LOW (ref 80.0–100.0)
Platelets: 278 10*3/uL (ref 150–400)
RBC: 5.59 MIL/uL — ABNORMAL HIGH (ref 3.87–5.11)
RDW: 15.6 % — ABNORMAL HIGH (ref 11.5–15.5)
WBC: 8.5 10*3/uL (ref 4.0–10.5)
nRBC: 0 % (ref 0.0–0.2)

## 2020-04-02 LAB — SARS CORONAVIRUS 2 BY RT PCR (HOSPITAL ORDER, PERFORMED IN ~~LOC~~ HOSPITAL LAB): SARS Coronavirus 2: NEGATIVE

## 2020-04-02 LAB — TYPE AND SCREEN
ABO/RH(D): A POS
Antibody Screen: NEGATIVE

## 2020-04-02 LAB — PROTIME-INR
INR: 0.9 (ref 0.8–1.2)
Prothrombin Time: 12.2 seconds (ref 11.4–15.2)

## 2020-04-02 LAB — APTT: aPTT: 30 seconds (ref 24–36)

## 2020-04-02 LAB — LACTIC ACID, PLASMA: Lactic Acid, Venous: 0.9 mmol/L (ref 0.5–1.9)

## 2020-04-02 LAB — LIPASE, BLOOD: Lipase: 34 U/L (ref 11–51)

## 2020-04-02 MED ORDER — HYDROMORPHONE HCL 1 MG/ML IJ SOLN
0.5000 mg | Freq: Once | INTRAMUSCULAR | Status: AC
  Administered 2020-04-02: 0.5 mg via INTRAVENOUS
  Filled 2020-04-02: qty 1

## 2020-04-02 MED ORDER — SODIUM CHLORIDE 0.9 % IV SOLN
2.0000 g | INTRAVENOUS | Status: DC
  Administered 2020-04-02: 2 g via INTRAVENOUS
  Filled 2020-04-02: qty 20

## 2020-04-02 MED ORDER — SODIUM CHLORIDE 0.9 % IV BOLUS
1000.0000 mL | Freq: Once | INTRAVENOUS | Status: AC
  Administered 2020-04-02: 1000 mL via INTRAVENOUS

## 2020-04-02 MED ORDER — DIPHENHYDRAMINE HCL 50 MG/ML IJ SOLN
INTRAMUSCULAR | Status: AC
  Filled 2020-04-02: qty 1

## 2020-04-02 MED ORDER — FENTANYL CITRATE (PF) 100 MCG/2ML IJ SOLN
50.0000 ug | INTRAMUSCULAR | Status: DC | PRN
  Administered 2020-04-02: 50 ug via INTRAVENOUS
  Filled 2020-04-02: qty 2

## 2020-04-02 MED ORDER — HYDROMORPHONE HCL 1 MG/ML IJ SOLN
0.5000 mg | INTRAMUSCULAR | Status: DC | PRN
  Administered 2020-04-02: 0.5 mg via INTRAVENOUS

## 2020-04-02 MED ORDER — DIPHENHYDRAMINE HCL 50 MG/ML IJ SOLN
50.0000 mg | Freq: Once | INTRAMUSCULAR | Status: AC
  Administered 2020-04-02: 50 mg via INTRAVENOUS

## 2020-04-02 MED ORDER — METHYLPREDNISOLONE SODIUM SUCC 125 MG IJ SOLR
125.0000 mg | Freq: Once | INTRAMUSCULAR | Status: AC
  Administered 2020-04-02: 125 mg via INTRAVENOUS
  Filled 2020-04-02: qty 2

## 2020-04-02 MED ORDER — VANCOMYCIN HCL 2000 MG/400ML IV SOLN
2000.0000 mg | Freq: Once | INTRAVENOUS | Status: AC
  Administered 2020-04-02: 2000 mg via INTRAVENOUS
  Filled 2020-04-02: qty 400

## 2020-04-02 MED ORDER — ONDANSETRON HCL 4 MG/2ML IJ SOLN
4.0000 mg | Freq: Once | INTRAMUSCULAR | Status: AC
  Administered 2020-04-02: 4 mg via INTRAVENOUS
  Filled 2020-04-02: qty 2

## 2020-04-02 MED ORDER — SODIUM CHLORIDE 0.9 % IV SOLN: Freq: Once | INTRAVENOUS | Status: AC

## 2020-04-02 MED ORDER — IOHEXOL 350 MG/ML SOLN
100.0000 mL | Freq: Once | INTRAVENOUS | Status: AC | PRN
  Administered 2020-04-02: 100 mL via INTRAVENOUS

## 2020-04-02 MED ORDER — FENTANYL CITRATE (PF) 100 MCG/2ML IJ SOLN
100.0000 ug | Freq: Once | INTRAMUSCULAR | Status: AC
  Administered 2020-04-02: 100 ug via INTRAVENOUS
  Filled 2020-04-02: qty 2

## 2020-04-02 MED ORDER — HYDROMORPHONE HCL 1 MG/ML IJ SOLN
0.5000 mg | Freq: Once | INTRAMUSCULAR | Status: DC
  Filled 2020-04-02: qty 1

## 2020-04-02 MED ORDER — PANTOPRAZOLE SODIUM 40 MG IV SOLR
40.0000 mg | Freq: Once | INTRAVENOUS | Status: AC
  Administered 2020-04-02: 40 mg via INTRAVENOUS
  Filled 2020-04-02: qty 40

## 2020-04-02 NOTE — ED Notes (Signed)
Called [facility] spoke with [place] for potential transfer

## 2020-04-02 NOTE — ED Notes (Signed)
Blood drawn and sent to lab.

## 2020-04-02 NOTE — Consult Note (Addendum)
PHARMACY -  BRIEF ANTIBIOTIC NOTE   Pharmacy has received consult(s) for vancomycin from an ED provider.  The patient's profile has been reviewed for ht/wt/allergies/indication/available labs.    One time order(s) placed for vancomycin 2 g IV x 1 dose  Further antibiotics/pharmacy consults should be ordered by admitting physician if indicated.                       Thank you,  Benn Moulder, PharmD Pharmacy Resident  04/02/2020 3:39 PM

## 2020-04-02 NOTE — ED Notes (Signed)
Full breast is discolored gray, Dr Hedwig Morton aware

## 2020-04-02 NOTE — ED Provider Notes (Signed)
-----------------------------------------   11:21 PM on 04/02/2020 -----------------------------------------  Patient has been accepted to [facility] emergency department ED to ED transfer for general surgery and plastic surgery/breast surgery evaluation.  Patient agreeable to plan of care and will be transferred once transportation has been arranged.   Harvest Dark, MD 04/02/20 2321

## 2020-04-02 NOTE — ED Notes (Signed)
Pt resting comfortably at this time, call bell within reach, stretcher locked in lowest position.  

## 2020-04-02 NOTE — ED Provider Notes (Addendum)
-----------------------------------------   6:16 PM on 04/02/2020 -----------------------------------------  Patient appears to be experiencing allergic reaction with hives mostly across her back. We will give 50 mg of Benadryl and 125 mg of Solu-Medrol. Patient was actively receiving vancomycin with itching started. Unaware of any antibiotic allergies previously. Patient also has been receiving Dilaudid and states she has had hives before with codeine-based medications. We will stop the Dilaudid and instead use fentanyl as needed for pain control. We'll continue to closely monitor. I spoke to [facility] plastic surgery Dr. Robina Ade, states she would be happy to evaluate the patient at [facility] to decide upon further treatment for the patient's ruptured implant with possible infection. We're currently awaiting [facility] general surgery to discuss the small bowel obstruction and hopeful admission with plastic surgery consultation. Patient is aware and updated.                 Harvest Dark, MD 04/02/20 Merrily Pew    Harvest Dark, MD 04/02/20 [id number]    Harvest Dark, MD 04/02/20 [phone]

## 2020-04-02 NOTE — ED Notes (Signed)
Pt rang call bell, RN to bedside. C/o hives to body. Redness and hives noted to back, abdomen. RR even and unlabored, no swelling to face.  Pt reports no known allergy to vanc or rocephin.  SpO2 95% on RA  Dr Kerman Passey notified, verbal order for 50 mg IV benadryl and to stop vanc. Vanc stopped.  Pt in NAD

## 2020-04-02 NOTE — ED Provider Notes (Signed)
[facility] Emergency Department Provider Note  ____________________________________________   First MD Initiated Contact with Patient 04/02/20 1313     (approximate)  I have reviewed the triage vital signs and the nursing notes.   HISTORY  Chief Complaint Shortness of Breath, Abdominal Pain (Cramping without diarrhea), Excessive Sweating, and Breast Problem (Black discoloration)    HPI Maria Potter is a 59 y.o. female  With h/o drug-induced lupus, metastatic ovarian CA, here with severe abd pain, nausea, vomiting. Pt reports 3-4 days of worsening initially primarily epigastric and RUQ abd pain, which is now her entire R side, along with nausea and NBNB emesis. She has had no diarrhea or constipation. She has now noticed worsening R breast pain as well with associated swelling, bruising, and tenderness. Reports her R breast has been bothering her for "months," and she has a known implant that she was told needs to come out by her PCP in [place]. No known fever, chills. No urinary symptoms.       Past Medical History:  Diagnosis Date  . Arthritis   . Cancer ([place])   . High cholesterol   . Hypertension   . Lupus ([place])    drug induced from chemo  . Ovarian cancer ([place])   . Renal disorder   . Thyroid disease     There are no problems to display for this patient.   Past Surgical History:  Procedure Laterality Date  . EXTERNAL EAR SURGERY     x 21    Prior to Admission medications   Medication Sig Start Date End Date Taking? Authorizing Provider  esomeprazole (NEXIUM) 20 MG capsule Take 20 mg by mouth daily. 02/27/20  Yes [provider]  hydrochlorothiazide (HYDRODIURIL) 25 MG tablet Take 25 mg by mouth daily. 02/26/20  Yes [provider]  meloxicam (MOBIC) 7.5 MG tablet Take 7.5 mg by mouth daily. 02/26/20  Yes [provider]  pravastatin (PRAVACHOL) 20 MG tablet Take 20 mg by mouth daily. 02/26/20  Yes [provider]  sertraline (ZOLOFT) 100 MG tablet Take 100 mg by mouth 2 (two) times daily. 12/05/19  Yes [provider]    Allergies Morphine and related and Codeine  No family history on file.  Social History Social History   Tobacco Use  . Smoking status: Never Smoker  . Smokeless tobacco: Never Used  Substance Use Topics  . Alcohol use: No  . Drug use: Not on file    Review of Systems  Review of Systems  Constitutional: Positive for fatigue. Negative for fever.  HENT: Negative for congestion and sore throat.   Eyes: Negative for visual disturbance.  Respiratory: Negative for cough and shortness of breath.   Cardiovascular: Negative for chest pain.  Gastrointestinal: Positive for abdominal pain, nausea and vomiting. Negative for diarrhea.  Genitourinary: Negative for flank pain.  Musculoskeletal: Negative for back pain and neck pain.  Skin: Negative for rash and wound.  Neurological: Positive for weakness.     ____________________________________________  PHYSICAL EXAM:      VITAL SIGNS: ED Triage Vitals  Enc Vitals Group     BP 04/02/20 1242 (!) 157/95     Pulse Rate 04/02/20 1242 85     Resp 04/02/20 1242 20     Temp 04/02/20 1242 98.5 F (36.9 C)     Temp Source 04/02/20 1242 Oral     SpO2 04/02/20 1242 96 %     Weight 04/02/20 1247 210 lb (95.3 kg)  Height 04/02/20 1247 5\' 7"  (1.702 m)     Head Circumference --      Peak Flow --      Pain Score 04/02/20 1244 10     Pain Loc --      Pain Edu? --      Excl. in [place]? --      Physical Exam Vitals and nursing note reviewed.  Constitutional:      General: She is in acute distress.     Appearance: She is well-developed.  HENT:     Head: Normocephalic and atraumatic.  Eyes:     Conjunctiva/sclera: Conjunctivae normal.  Cardiovascular:     Rate and Rhythm: Regular rhythm. Tachycardia present.     Heart sounds: Normal heart sounds. No murmur heard.  No friction rub.  Pulmonary:      Effort: Pulmonary effort is normal. Tachypnea present. No respiratory distress.     Breath sounds: Normal breath sounds. No wheezing or rales.  Chest:     Comments: Marked asymmetric swelling of R breast with ecchymoses, redness, warmth extending along inferior breast. Abdominal:     General: There is distension.     Palpations: Abdomen is soft.     Tenderness: There is generalized abdominal tenderness.  Musculoskeletal:     Cervical back: Neck supple.     Right lower leg: No edema.     Left lower leg: No edema.  Skin:    General: Skin is warm.     Capillary Refill: Capillary refill takes less than 2 seconds.  Neurological:     Mental Status: She is alert and oriented to person, place, and time.     Motor: No abnormal muscle tone.       ____________________________________________   LABS (all labs ordered are listed, but only abnormal results are displayed)  Labs Reviewed  CBC - Abnormal; Notable for the following components:      Result Value   RBC 5.59 (*)    MCV 77.1 (*)    RDW 15.6 (*)    All other components within normal limits  COMPREHENSIVE METABOLIC PANEL - Abnormal; Notable for the following components:   Glucose, Bld 118 (*)    Total Protein 8.7 (*)    All other components within normal limits  SARS CORONAVIRUS 2 BY RT PCR (HOSPITAL ORDER, [place] [facility])  CULTURE, BLOOD (ROUTINE X 2)  CULTURE, BLOOD (ROUTINE X 2)  PROTIME-INR  APTT  LIPASE, BLOOD  LACTIC ACID, PLASMA  DIFFERENTIAL  TYPE AND SCREEN    ____________________________________________  EKG: Normal sinus rhythm, VR 75. QRS 84, QTc 485. RVH, borderline TW changes. No acute St elevations. ________________________________________  RADIOLOGY All imaging, including plain films, CT scans, and ultrasounds, independently reviewed by me, and interpretations confirmed via formal radiology reads.  ED MD interpretation:   CT A/P: No dissection. R breast prosthesis with complex  fluid density concerning for hematoma vs infection vs implant rupture. +SB. Subpleural nodules.   Official radiology report(s): CT Angio Chest/Abd/Pel for Dissection W and/or Wo Contrast  Result Date: 04/02/2020 CLINICAL DATA:  59 year old female with acute shortness of breath, discoloration of the right breast, abdominal cramping, nausea, diaphoresis, abdominal and back pain. EXAM: CT ANGIOGRAPHY CHEST, ABDOMEN AND PELVIS TECHNIQUE: Non-contrast CT of the chest was initially obtained. Multidetector CT imaging through the chest, abdomen and pelvis was performed using the standard protocol during bolus administration of intravenous contrast. Multiplanar reconstructed images and MIPs were obtained and reviewed to evaluate the  vascular anatomy. CONTRAST:  17mL OMNIPAQUE IOHEXOL 350 MG/ML SOLN COMPARISON:  Prior CT scan of the chest 09/09/2008 FINDINGS: CTA CHEST FINDINGS Cardiovascular: Preferential opacification of the thoracic aorta. No evidence of thoracic aortic aneurysm or dissection. Normal heart size. No pericardial effusion. Mild atherosclerotic calcifications visualized along the thoracic aorta. Mediastinum/Nodes: Unremarkable CT appearance of the thyroid gland. No suspicious mediastinal or hilar adenopathy. No soft tissue mediastinal mass. Small hiatal hernia. Lungs/Pleura: Developing subpleural nodular opacities in the periphery of the left upper lung including image 33, 83 and 88 of series 7. The largest measures 6 mm in size. The lungs are otherwise clear save for mild dependent atelectasis in the lower lobes. Musculoskeletal: Significant interval change in the appearance of the right breast augmentation prosthesis compared to prior imaging from February of 2010. There is now evidence of new separation between the lining of the prosthesis and the calcified outer pseudocapsule. Additionally, fluid and interstitial stranding surround the entirety of the right breast prosthesis and extend inferiorly  along the right chest wall. No acute osseous abnormality. Review of the MIP images confirms the above findings. CTA ABDOMEN AND PELVIS FINDINGS VASCULAR Aorta: Normal caliber aorta without aneurysm, dissection, vasculitis or significant stenosis. Celiac: Patent without evidence of aneurysm, dissection, vasculitis or significant stenosis. SMA: Patent without evidence of aneurysm, dissection, vasculitis or significant stenosis. Renals: Both renal arteries demonstrate a subtle beaded appearance which may represent fibromuscular dysplasia. The arteries remain patent. IMA: Patent without evidence of aneurysm, dissection, vasculitis or significant stenosis. Inflow: Patent without evidence of aneurysm, dissection, vasculitis or significant stenosis. Veins: No focal venous abnormality. Review of the MIP images confirms the above findings. NON-VASCULAR Hepatobiliary: Stable appearance of multifocal circumscribed low-attenuation lesions throughout the liver dating back to 2017. Findings remain most consistent with multifocal hepatic cysts. No solid lesion identified. Gallbladder is unremarkable. No intra or extrahepatic biliary ductal dilatation. Pancreas: Unremarkable. No pancreatic ductal dilatation or surrounding inflammatory changes. Spleen: Normal in size without focal abnormality. Adrenals/Urinary Tract: Adrenal glands are unremarkable. Kidneys are normal, without renal calculi, focal lesion, or hydronephrosis. Bladder is unremarkable. Small circumscribed low-attenuation renal lesions bilaterally are too small for accurate characterization, but statistically likely to represent benign cysts. Stomach/Bowel: Mild colonic diverticulosis without evidence of active inflammation. Multiple loops of dilated and fluid-filled small bowel are present in the mid and lower abdomen. There is a gradual transition to normal caliber bowel without a focal transition point. Some flocculent air is present within the dilated loops of bowel  suggesting delayed transit of contents. Lymphatic: No suspicious lymphadenopathy. Reproductive: Status post hysterectomy. No adnexal masses. Other: No abdominal wall hernia or abnormality. No abdominopelvic ascites. Musculoskeletal: No acute fracture or aggressive appearing lytic or blastic osseous lesion. Review of the MIP images confirms the above findings. IMPRESSION: 1. No evidence of acute aortic dissection, aneurysm or other acute vascular abnormality. 2. Significant change in the appearance of the right breast augmentation prosthesis compared to prior imaging from 2010. New mildly complex fluid density noted between the calcified pseudo capsule and the implant resulting in local mass effect on the implant itself. Additionally, fluid and inflammatory changes surround the prosthesis and extend inferiorly along the chest wall. The changes in the subcutaneous fat likely correlate with the patient's reported ecchymosis. Differential considerations include implant associated lymphoma, implant rupture, and, in the appropriate clinical setting, infection. Recommend follow-up with diagnostic mammography and ultrasound evaluation. 3. Multiple loops of dilated and fluid-filled small bowel present in the mid and lower abdomen without focal transition  point. Differential considerations include infectious/inflammatory enteritis versus low-grade or partial small bowel obstruction. 4. Several subpleural pulmonary nodules are present in the periphery of the left lung measuring up to 6 mm in diameter. These may reflect an underlying infectious/inflammatory process. Non-contrast chest CT at 3-6 months is recommended. If the nodules are stable at time of repeat CT, then future CT at 18-24 months (from today's scan) is considered optional for low-risk patients, but is recommended for high-risk patients. This recommendation follows the consensus statement: Guidelines for Management of Incidental Pulmonary Nodules Detected on CT  Images: From the Fleischner Society 2017; Radiology 2017; 284:228-243. 5. Subtle beaded appearance of the bilateral renal arteries which may reflect underlying fibromuscular dysplasia. Does the patient have a history of medically refractory hypertension? 6.  Aortic Atherosclerosis (ICD10-I70.0). 7. Mild colonic diverticulosis without evidence of active inflammation. Electronically Signed   By: Jacqulynn Cadet M.D.   On: 04/02/2020 14:48    ____________________________________________  PROCEDURES   Procedure(s) performed (including Critical Care):  Procedures  ____________________________________________  INITIAL IMPRESSION / MDM / [place] / ED COURSE  As part of my medical decision making, I reviewed the following data within the [place] notes reviewed and incorporated, Old chart reviewed, Notes from prior ED visits, and [place] Controlled Substance Database       *Aylani Spurlock was evaluated in Emergency Department on 04/02/2020 for the symptoms described in the history of present illness. She was evaluated in the context of the global COVID-19 pandemic, which necessitated consideration that the patient might be at risk for infection with the SARS-CoV-2 virus that causes COVID-19. Institutional protocols and algorithms that pertain to the evaluation of patients at risk for COVID-19 are in a state of rapid change based on information released by regulatory bodies including the CDC and federal and state organizations. These policies and algorithms were followed during the patient's care in the ED.  Some ED evaluations and interventions may be delayed as a result of limited staffing during the pandemic.*     Medical Decision Making:  60 yo F here with severe R breast and abd pain, nausea, vomiting. Re: n/v, pt CT scan is consistent with early partial SBO. No complications. Incidentally, pt also has R breast implant rupture with surrounding inflammation and  hematoma. Unclear whether she has had a smoldering infection vs rupture here leading to pain, with ileus and now SBO, versus vomiting from SBO causing a rupture. Will need transfer to center with breast surgery capabilities. Will make NPO, place IV and NGT, and call [facility] and [facility] for transfer as able.  Given degree of redness and swelling on breast, will also add on Vanc/Rocephin in event of surgery or concomitant infection. ____________________________________________  FINAL CLINICAL IMPRESSION(S) / ED DIAGNOSES  Final diagnoses:  Pain  Breast implant rupture, initial encounter  SBO (small bowel obstruction) (HCC)     MEDICATIONS GIVEN DURING THIS VISIT:  Medications  0.9 %  sodium chloride infusion (has no administration in time range)  cefTRIAXone (ROCEPHIN) 2 g in sodium chloride 0.9 % 100 mL IVPB (has no administration in time range)  HYDROmorphone (DILAUDID) injection 0.5 mg (has no administration in time range)  ondansetron (ZOFRAN) injection 4 mg (has no administration in time range)  sodium chloride 0.9 % bolus 1,000 mL (1,000 mLs Intravenous New Bag/Given 04/02/20 1330)  HYDROmorphone (DILAUDID) injection 0.5 mg (0.5 mg Intravenous Given 04/02/20 1334)  ondansetron (ZOFRAN) injection 4 mg (4 mg Intravenous Given 04/02/20 1331)  pantoprazole (PROTONIX) injection 40 mg (40 mg Intravenous Given 04/02/20 1333)  iohexol (OMNIPAQUE) 350 MG/ML injection 100 mL (100 mLs Intravenous Contrast Given 04/02/20 1354)  HYDROmorphone (DILAUDID) injection 0.5 mg (0.5 mg Intravenous Given 04/02/20 1514)     ED Discharge Orders    None       Note:  This document was prepared using Dragon voice recognition software and may include unintentional dictation errors.   Duffy Bruce, MD 04/02/20 1537

## 2020-04-02 NOTE — ED Notes (Signed)
Dr Kerman Passey aware of light red/brown drainage from ng tube

## 2020-04-02 NOTE — ED Triage Notes (Signed)
Patient to ED for shortness of breath, black discoloration of breast, abdominal cramping, nausea and excessive sweating.

## 2020-04-07 LAB — CULTURE, BLOOD (ROUTINE X 2)
Culture: NO GROWTH
Culture: NO GROWTH
Special Requests: ADEQUATE
Special Requests: ADEQUATE

## 2020-07-31 ENCOUNTER — Other Ambulatory Visit: Payer: Medicare PPO

## 2021-04-06 IMAGING — DX DG ABDOMEN 1V
1 series · 1 of 1 positions shown · non-contrast
Comparison: None.

CLINICAL DATA: Nasogastric tube placement.

EXAM:
ABDOMEN - 1 VIEW

[abdomen supine]
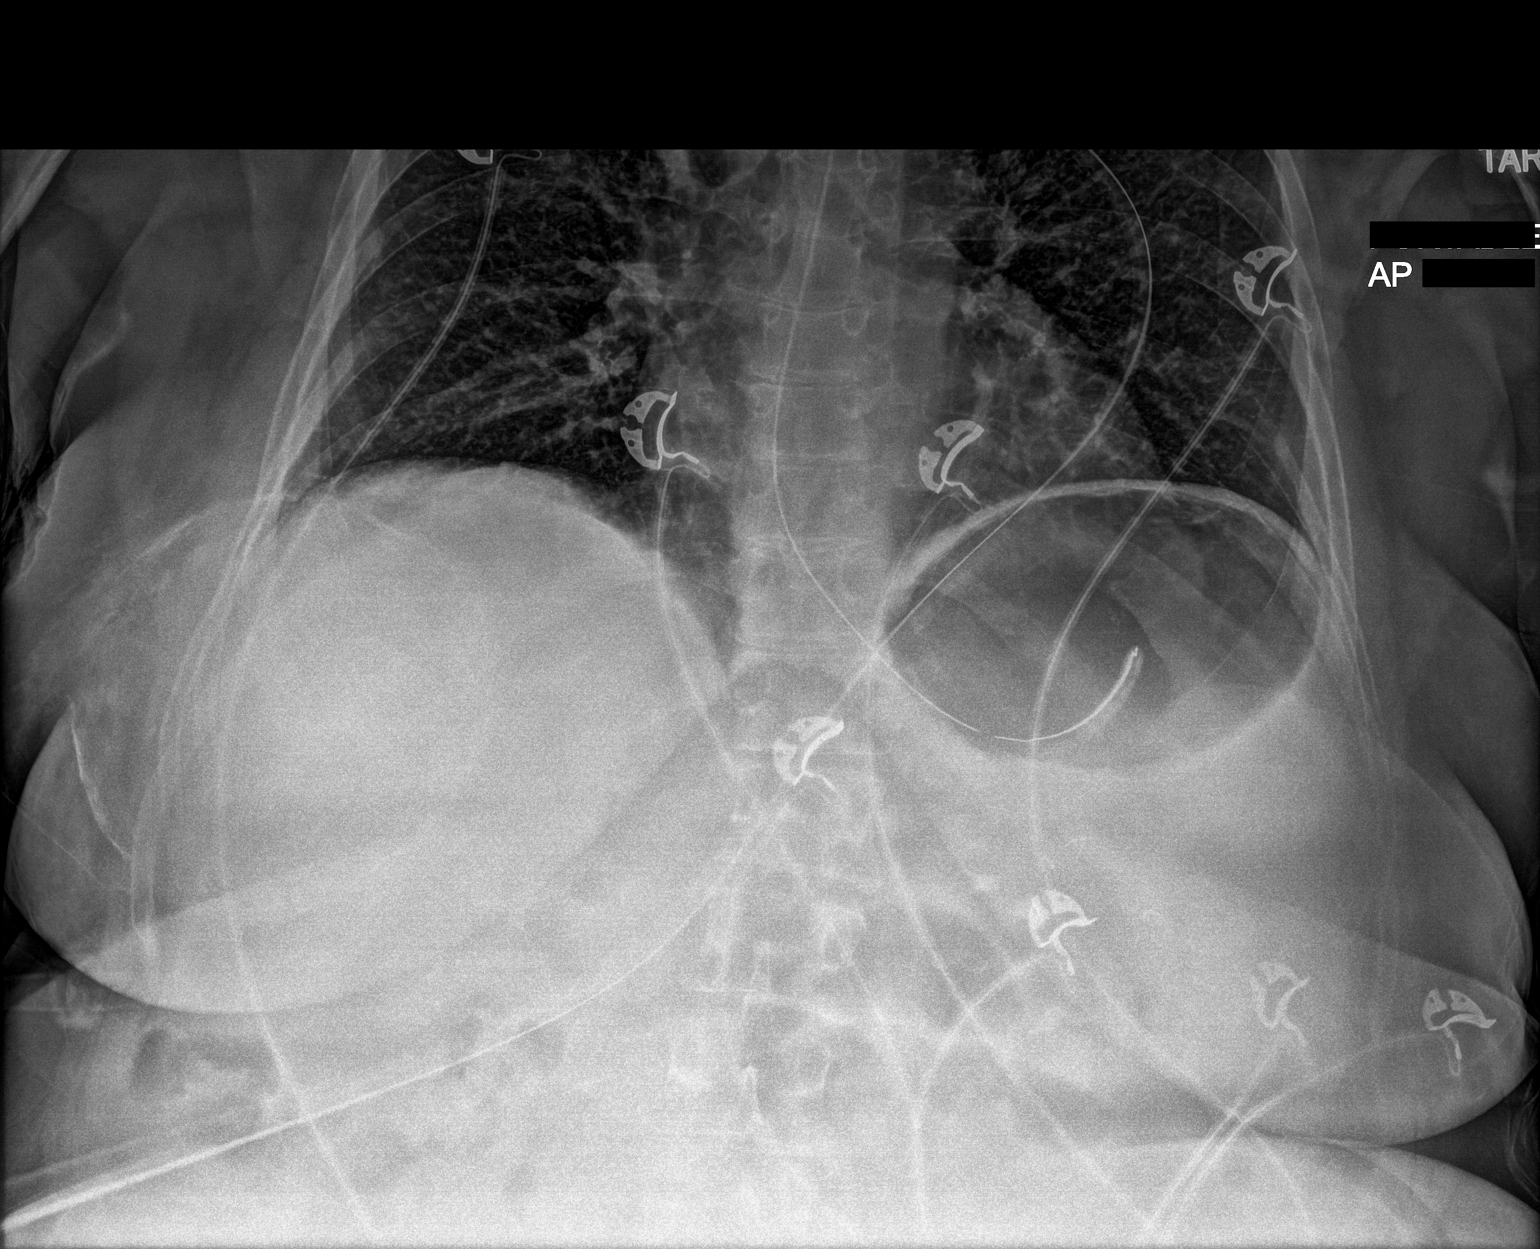

[1 of 1 positions shown; findings below may reference images not displayed]

FINDINGS: A nasogastric tube is seen with its distal tip overlying the body of
the stomach. Mildly dilated small bowel loops are seen within the
medial aspect of the left upper quadrant. It should be noted that
the lower abdomen and pelvis is limited in evaluation secondary to
image cut off. No radio-opaque calculi or other significant
radiographic abnormality are seen.
IMPRESSION: 1. Nasogastric tube positioning, as described above.
2. Findings consistent with a partial small bowel obstruction versus
ileus.

## 2021-08-04 ENCOUNTER — Emergency Department
Admission: EM | Admit: 2021-08-04 | Discharge: 2021-08-04 | Disposition: A | Discharge status: Home / Self Care | Payer: Medicare PPO | Attending: Emergency Medicine | Admitting: Emergency Medicine

## 2021-08-04 ENCOUNTER — Other Ambulatory Visit: Payer: Self-pay

## 2021-08-04 ENCOUNTER — Emergency Department: Payer: Medicare PPO

## 2021-08-04 DIAGNOSIS — Z8543 Personal history of malignant neoplasm of ovary: Secondary | ICD-10-CM | POA: Insufficient documentation

## 2021-08-04 DIAGNOSIS — M545 Low back pain, unspecified: Secondary | ICD-10-CM | POA: Diagnosis present

## 2021-08-04 DIAGNOSIS — R6883 Chills (without fever): Secondary | ICD-10-CM | POA: Diagnosis not present

## 2021-08-04 DIAGNOSIS — R109 Unspecified abdominal pain: Secondary | ICD-10-CM | POA: Diagnosis not present

## 2021-08-04 DIAGNOSIS — R319 Hematuria, unspecified: Secondary | ICD-10-CM | POA: Diagnosis not present

## 2021-08-04 DIAGNOSIS — E039 Hypothyroidism, unspecified: Secondary | ICD-10-CM | POA: Diagnosis not present

## 2021-08-04 DIAGNOSIS — I10 Essential (primary) hypertension: Secondary | ICD-10-CM | POA: Diagnosis not present

## 2021-08-04 LAB — CBC
HCT: 43 % (ref 36.0–46.0)
Hemoglobin: 13.8 g/dL (ref 12.0–15.0)
MCH: 25.8 pg — ABNORMAL LOW (ref 26.0–34.0)
MCHC: 32.1 g/dL (ref 30.0–36.0)
MCV: 80.4 fL (ref 80.0–100.0)
Platelets: 220 K/uL (ref 150–400)
RBC: 5.35 MIL/uL — ABNORMAL HIGH (ref 3.87–5.11)
RDW: 14.6 % (ref 11.5–15.5)
WBC: 6 K/uL (ref 4.0–10.5)
nRBC: 0 % (ref 0.0–0.2)

## 2021-08-04 LAB — BASIC METABOLIC PANEL WITH GFR
Anion gap: 8 (ref 5–15)
BUN: 15 mg/dL (ref 6–20)
CO2: 30 mmol/L (ref 22–32)
Calcium: 9.1 mg/dL (ref 8.9–10.3)
Chloride: 99 mmol/L (ref 98–111)
Creatinine, Ser: 0.99 mg/dL (ref 0.44–1.00)
GFR, Estimated: >60 mL/min
Glucose, Bld: 122 mg/dL — ABNORMAL HIGH (ref 70–99)
Potassium: 3.4 mmol/L — ABNORMAL LOW (ref 3.5–5.1)
Sodium: 137 mmol/L (ref 135–145)

## 2021-08-04 LAB — URINALYSIS, ROUTINE W REFLEX MICROSCOPIC
Bilirubin Urine: NEGATIVE
Glucose, UA: NEGATIVE mg/dL
Hgb urine dipstick: NEGATIVE
Ketones, ur: NEGATIVE mg/dL
Leukocytes,Ua: NEGATIVE
Nitrite: NEGATIVE
Protein, ur: NEGATIVE mg/dL
Specific Gravity, Urine: 1.018 (ref 1.005–1.030)
pH: 6 (ref 5.0–8.0)

## 2021-08-04 LAB — HEPATIC FUNCTION PANEL
ALT: 32 U/L (ref 0–44)
AST: 38 U/L (ref 15–41)
Albumin: 4.1 g/dL (ref 3.5–5.0)
Alkaline Phosphatase: 101 U/L (ref 38–126)
Bilirubin, Direct: <0.1 mg/dL (ref 0.0–0.2)
Total Bilirubin: 0.5 mg/dL (ref 0.3–1.2)
Total Protein: 7.4 g/dL (ref 6.5–8.1)

## 2021-08-04 LAB — LIPASE, BLOOD: Lipase: 34 U/L (ref 11–51)

## 2021-08-04 MED ORDER — CEPHALEXIN 500 MG PO CAPS: 500.0000 mg | ORAL_CAPSULE | Freq: Four times a day (QID) | ORAL | 0 refills | Status: AC

## 2021-08-04 MED ORDER — KETOROLAC TROMETHAMINE 30 MG/ML IJ SOLN
30.0000 mg | Freq: Once | INTRAMUSCULAR | Status: AC
  Administered 2021-08-04: 30 mg via INTRAVENOUS
  Filled 2021-08-04: qty 1

## 2021-08-04 MED ORDER — IOHEXOL 300 MG/ML  SOLN
100.0000 mL | Freq: Once | INTRAMUSCULAR | Status: AC | PRN
  Administered 2021-08-04: 100 mL via INTRAVENOUS
  Filled 2021-08-04: qty 100

## 2021-08-04 MED ORDER — ONDANSETRON 4 MG PO TBDP
4.0000 mg | ORAL_TABLET | Freq: Once | ORAL | Status: AC
  Administered 2021-08-04: 4 mg via ORAL
  Filled 2021-08-04: qty 1

## 2021-08-04 MED ORDER — OXYCODONE-ACETAMINOPHEN 5-325 MG PO TABS
1.0000 | ORAL_TABLET | Freq: Once | ORAL | Status: AC
  Administered 2021-08-04: 1 via ORAL
  Filled 2021-08-04: qty 1

## 2021-08-04 MED ORDER — HYDROCODONE-ACETAMINOPHEN 5-325 MG PO TABS
1.0000 | ORAL_TABLET | Freq: Four times a day (QID) | ORAL | 0 refills | Status: AC | PRN
Start: 2021-08-04 — End: 2021-08-07

## 2021-08-04 MED ORDER — ONDANSETRON 4 MG PO TBDP: 4.0000 mg | ORAL_TABLET | Freq: Three times a day (TID) | ORAL | 0 refills | Status: AC | PRN

## 2021-08-04 MED ORDER — SODIUM CHLORIDE 0.9 % IV SOLN
1.0000 g | Freq: Once | INTRAVENOUS | Status: AC
  Administered 2021-08-04: 1 g via INTRAVENOUS
  Filled 2021-08-04: qty 10

## 2021-08-04 NOTE — ED Provider Notes (Signed)
[facility] Provider Note  Patient Contact: 5:42 PM (approximate)   History   Flank Pain and uti   HPI  Maria Potter is a 61 y.o. female with a history of hypertension, endometriosis, ovarian cancer status post oophorectomy, hypothyroidism and lupus, presents to the emergency department with left-sided low back pain.  Patient states that she was diagnosed with a urinary tract infection 2 days ago.  Patient received an injection of Rocephin and was started on ciprofloxacin.  Patient has had 3 doses.  She states that originally she had some dysuria, hematuria and chills.  She states that the dysuria has improved but her left-sided low back pain has worsened in intensity.  She denies radiation of her pain.  She is had no nausea or vomiting at home.  She adamantly denies shortness of breath, chest tightness or chest pain.  No upper back pain.      Physical Exam   Triage Vital Signs: ED Triage Vitals [08/04/21 1359]  Enc Vitals Group     BP (!) 141/75     Pulse Rate (!) 110     Resp 18     Temp 98.2 F (36.8 C)     Temp Source Oral     SpO2 93 %     Weight 207 lb (93.9 kg)     Height 5\' 7"  (1.702 m)     Head Circumference      Peak Flow      Pain Score 10     Pain Loc      Pain Edu?      Excl. in [place]?     Most recent vital signs: Vitals:   08/04/21 1658 08/04/21 2016  BP: 124/60 125/74  Pulse: 85 82  Resp: 20 18  Temp:    SpO2: 98% 96%     General: Alert and in no acute distress. Eyes:  PERRL. EOMI Head: No acute traumatic findings ENT:      Ears:       Nose: No congestion/rhinnorhea.      Mouth/Throat: Mucous membranes are moist. Neck: No stridor. No cervical spine tenderness to palpation. Cardiovascular:  Good peripheral perfusion Respiratory: Normal respiratory effort without tachypnea or retractions. Lungs CTAB. Good air entry to the bases with no decreased or absent breath sounds. Gastrointestinal: Bowel sounds 4 quadrants. Soft and  nontender to palpation. No guarding or rigidity. No palpable masses. No distention. No CVA tenderness. Musculoskeletal: Full range of motion to all extremities.  Neurologic:  No gross focal neurologic deficits are appreciated.  Skin:   No rash noted Other:   ED Results / Procedures / Treatments   Labs (all labs ordered are listed, but only abnormal results are displayed) Labs Reviewed  URINALYSIS, ROUTINE W REFLEX MICROSCOPIC - Abnormal; Notable for the following components:      Result Value   Color, Urine YELLOW (*)    APPearance HAZY (*)    All other components within normal limits  BASIC METABOLIC PANEL - Abnormal; Notable for the following components:   Potassium 3.4 (*)    Glucose, Bld 122 (*)    All other components within normal limits  CBC - Abnormal; Notable for the following components:   RBC 5.35 (*)    MCH 25.8 (*)    All other components within normal limits  URINE CULTURE  HEPATIC FUNCTION PANEL  LIPASE, BLOOD        RADIOLOGY  I personally viewed and evaluated these images as part  of my medical decision making, as well as reviewing the written report by the radiologist.  ED Provider Interpretation: CT abdomen pelvis was reviewed by me and shows no acute abdominal pelvic abnormality.   PROCEDURES:  Critical Care performed: No  Procedures   MEDICATIONS ORDERED IN ED: Medications  oxyCODONE-acetaminophen (PERCOCET/ROXICET) 5-325 MG per tablet 1 tablet (1 tablet Oral Given 08/04/21 1709)  ondansetron (ZOFRAN-ODT) disintegrating tablet 4 mg (4 mg Oral Given 08/04/21 1709)  iohexol (OMNIPAQUE) 300 MG/ML solution 100 mL (100 mLs Intravenous Contrast Given 08/04/21 1801)  ketorolac (TORADOL) 30 MG/ML injection 30 mg (30 mg Intravenous Given 08/04/21 1905)  cefTRIAXone (ROCEPHIN) 1 g in sodium chloride 0.9 % 100 mL IVPB (0 g Intravenous Stopped 08/04/21 1950)     IMPRESSION / MDM / ASSESSMENT AND PLAN / ED COURSE  I reviewed the triage vital signs and the  nursing notes.                              Differential diagnosis includes, but is not limited to, pyelonephritis, nephrolithiasis, pancreatitis, small bowel obstruction...  Assessment and Plan:  Low back pain 61 year old female presents to the emergency department with acute left-sided low back pain with recent dysuria.  Vital signs were reassuring at triage.  Patient was satting at 96% on room air with no increased work of breathing.  On physical exam, patient localized her pain to the left side of her lumbar spine. Asked patient multiple times if she had any left upper back pain, cough, shortness of breath, chest tightness or chest pain and she denied these symptoms.  CBC, BMP, hepatic function panel and lipase were within reference range.  Urinalysis showed no signs of UTI.  However the results of this urinalysis could be affected by recent Cipro use.  Urine culture in process at this time.  CT abdomen pelvis was obtained which showed no evidence of acute abdominal pelvic abnormality  Patient was given IV Toradol and Rocephin.  I did discontinue ciprofloxacin as I am concerned about resistance in our community and started patient on Keflex.  Patient was given strict return precautions to return to the emergency department with worsening pain, shortness of breath or other new or worsening symptoms.      FINAL CLINICAL IMPRESSION(S) / ED DIAGNOSES   Final diagnoses:  Flank pain     Rx / DC Orders   ED Discharge Orders          Ordered    cephALEXin (KEFLEX) 500 MG capsule  4 times daily        08/04/21 1932    HYDROcodone-acetaminophen (NORCO) 5-325 MG tablet  Every 6 hours PRN        08/04/21 1932    ondansetron (ZOFRAN-ODT) 4 MG disintegrating tablet  Every 8 hours PRN        08/04/21 1932             Note:  This document was prepared using Dragon voice recognition software and may include unintentional dictation errors.   Vallarie Mare [place], PA-C 08/04/21 2106     Lucrezia Starch, MD 08/05/21 1005

## 2021-08-04 NOTE — ED Notes (Signed)
See triage note for additional info, Pt to ED c/o flank pain and UTI symptoms. Pt states she was seen at [facility] about 2 days ago and was prescribed abx. Pt still having symptoms of dysuria and flank pain. Denies any recent fever.   Pt is A&Ox4.

## 2021-08-04 NOTE — Discharge Instructions (Addendum)
You can take Norco as needed for pain. Discontinue Cipro. Start Keflex four times daily for the next seven days. Return in two days if symptoms worsen.

## 2021-08-04 NOTE — ED Triage Notes (Signed)
Pt reports that she has had kidney stones in the past, states that she was seen by [facility] a couple days ago and was given an antibiotic injection and antibiotics PO (cipro). Pt states that she cont to have left flank pain and is hurting really bad and has been unable to pass the stone that she possibly has

## 2021-08-06 LAB — URINE CULTURE: Culture: <10000 — AB

## 2022-04-19 ENCOUNTER — Ambulatory Visit: Payer: Medicare PPO | Admitting: Nurse Practitioner

## 2022-08-08 IMAGING — CT CT ABD-PELV W/ CM
2 of 5 series · 16 of 46 positions shown, 18 images · IV contrast (APPLIED)
Comparison: April 02, 2020

CLINICAL DATA: Flank pain kidney stone suspected

EXAM:
CT ABDOMEN AND PELVIS WITH CONTRAST
TECHNIQUE: Multidetector CT imaging of the abdomen and pelvis was performed
using the standard protocol following bolus administration of
intravenous contrast.

[Series 2: routine abd/pel with · axial · 0.98mm/px · z∈[-634,-184]mm · 13 of 101 slices shown, 15 images]
[im 6/101  soft-tissue]
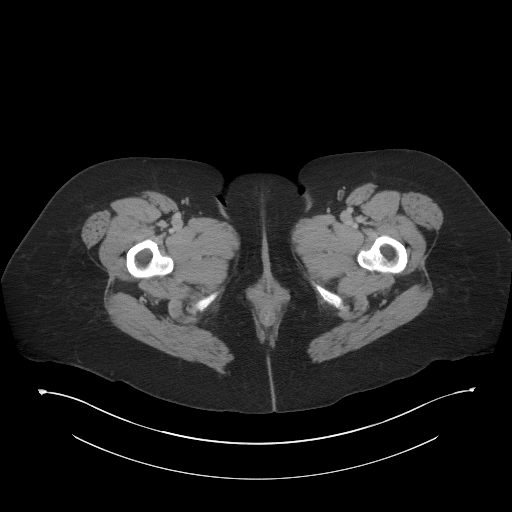
[im 6/101  bone]
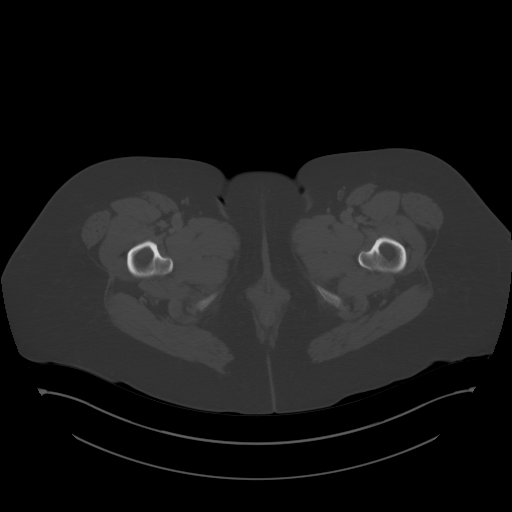
[im 16/101  soft-tissue]
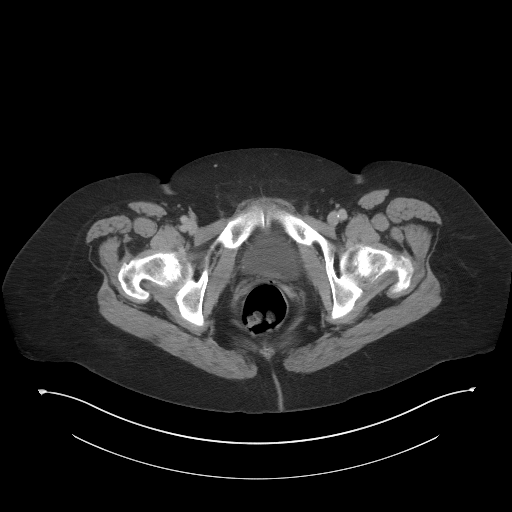
[im 21/101  soft-tissue]
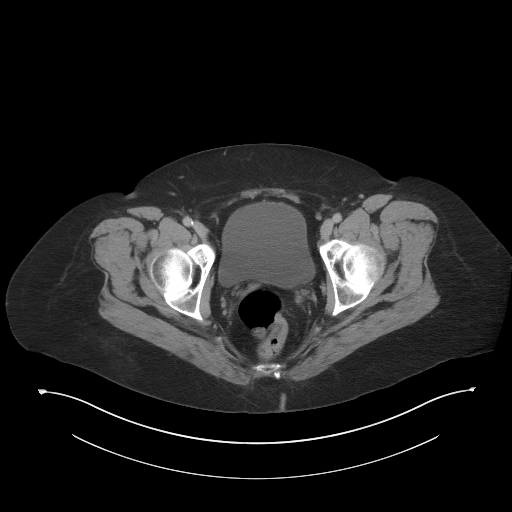
[im 31/101  soft-tissue]
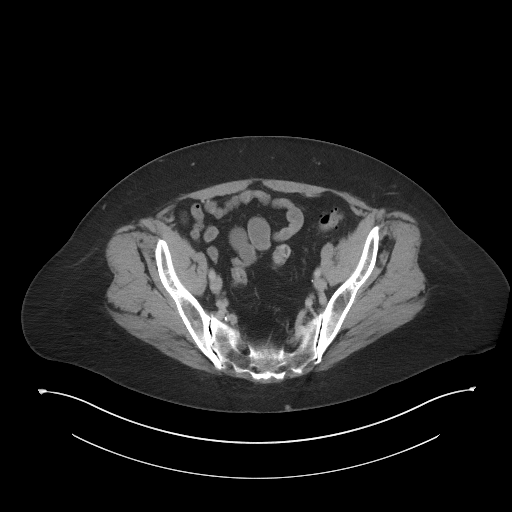
[im 36/101  soft-tissue]
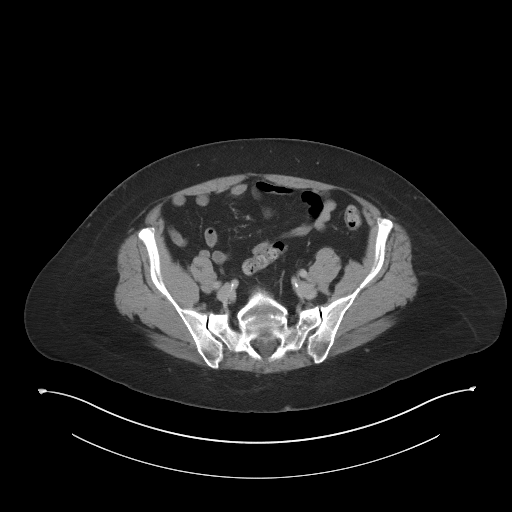
[im 46/101  soft-tissue]
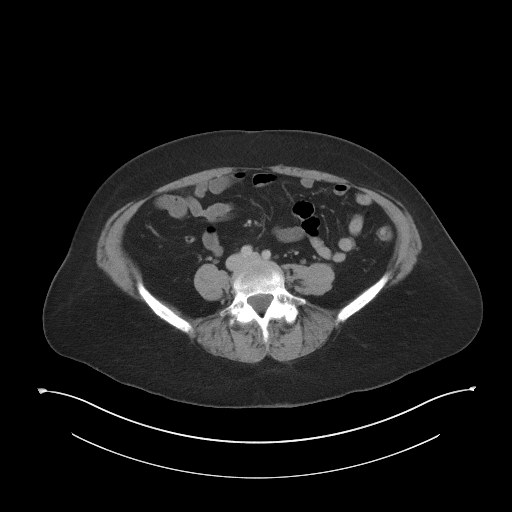
[im 51/101  soft-tissue]
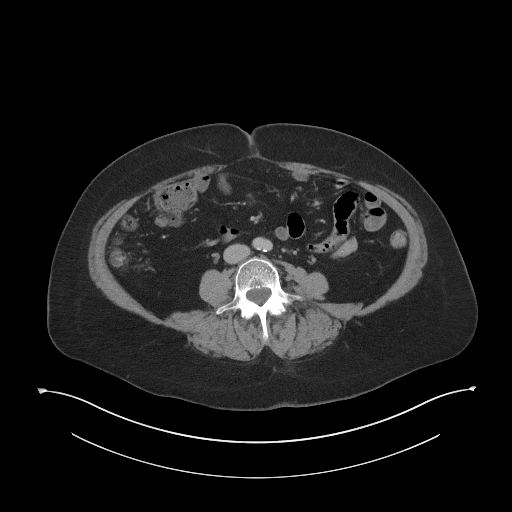
[im 56/101  soft-tissue]
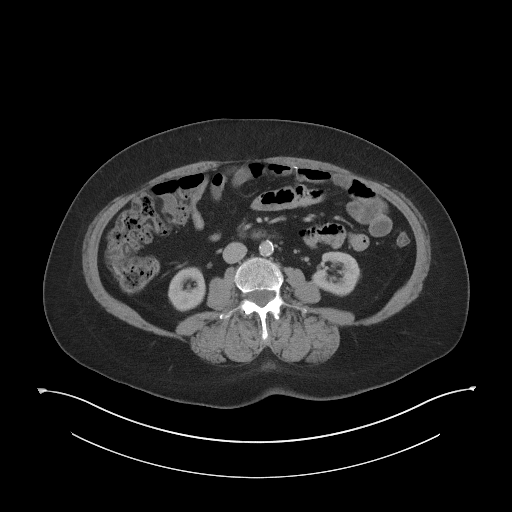
[im 66/101  soft-tissue]
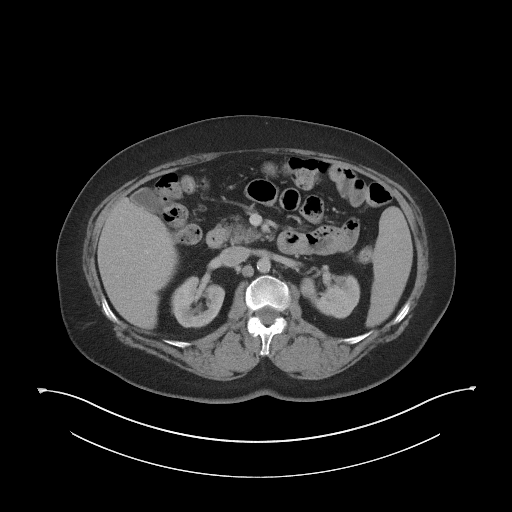
[im 66/101  bone]
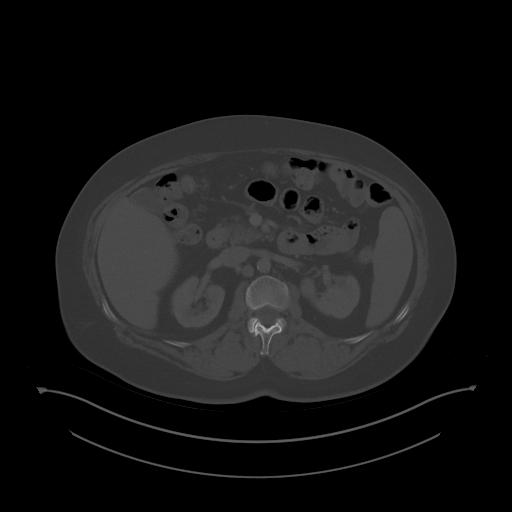
[im 71/101  soft-tissue]
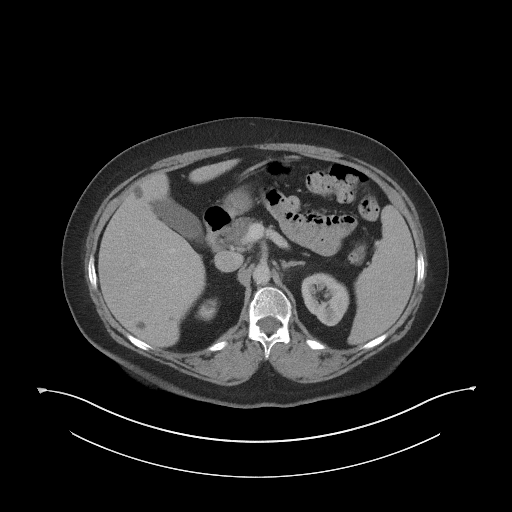
[im 81/101  soft-tissue]
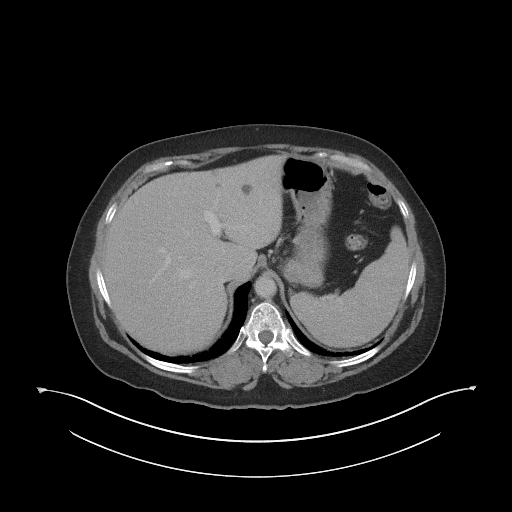
[im 86/101  soft-tissue]
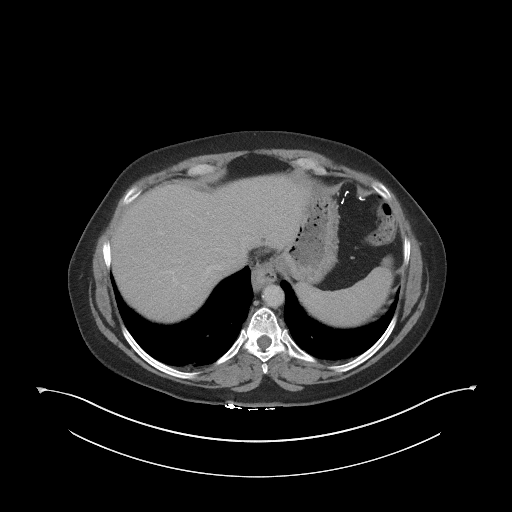
[im 96/101  soft-tissue]
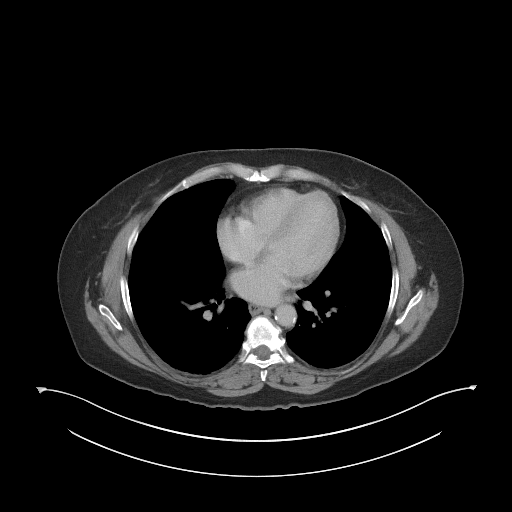

[Series 5: coronal st · coronal · 0.84mm/px · 3 of 98 slices shown]
[im 33/98  soft-tissue]
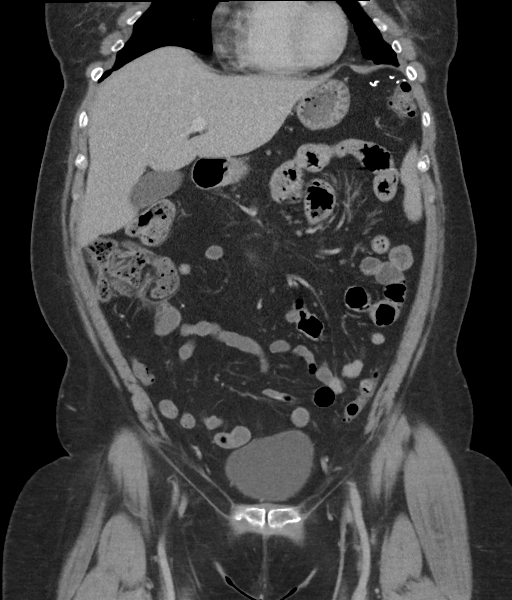
[im 44/98  soft-tissue]
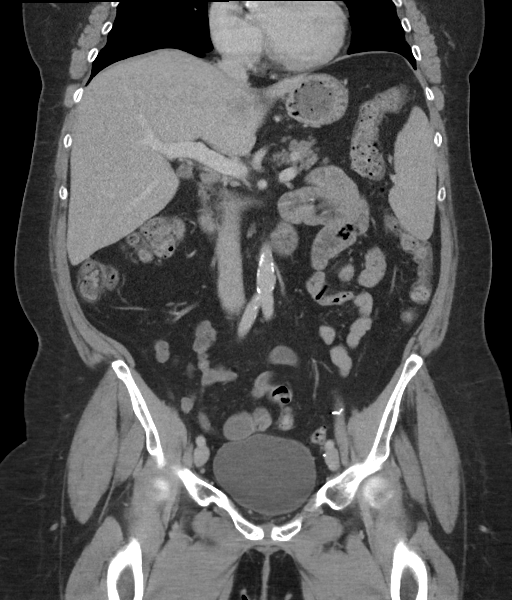
[im 54/98  soft-tissue]
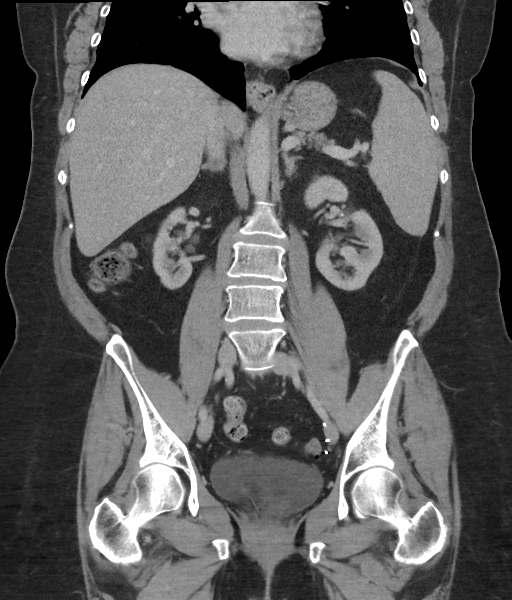

[16 of 46 positions shown; findings below may reference images not displayed]

RADIATION DOSE REDUCTION: This exam was performed according to the
departmental dose-optimization program which includes automated
exposure control, adjustment of the mA and/or kV according to
patient size and/or use of iterative reconstruction technique.

CONTRAST:  100mL OMNIPAQUE IOHEXOL 300 MG/ML  SOLN
FINDINGS: Lower chest: Bibasilar atelectasis for scarring.

Hepatobiliary: Bilobar hypodense hepatic lesions measuring up to 2
cm on image [DATE] and overall stable dating back to at least 6038
with the larger lesions measuring fluid density, consistent with
hepatic cysts. Gallbladder is unremarkable. No biliary ductal
dilation.

Pancreas: No pancreatic ductal dilation or evidence of acute
inflammation.

Spleen: Normal in size without focal abnormality.

Adrenals/Urinary Tract: Bilateral adrenal glands are unremarkable.
No hydronephrosis. No radiopaque renal calculus visualized
evaluation of which on this study is limited by intravenous contrast
material. Bilateral subcentimeter hypodense renal lesions are
technically too small to accurately characterize but statistically
likely to reflect cysts. Symmetric enhancement and excretion of
contrast material. No solid enhancing renal mass. Urinary bladder is
unremarkable for degree of distension.

Stomach/Bowel: Small hiatal hernia. Stomach is minimally distended
without wall thickening. No pathologic dilation of small or large
bowel. Terminal ileum appears normal. The appendix is not
confidently identified however there is no pericecal inflammation.
Scattered left-sided colonic diverticulosis without findings of
acute diverticulitis.

Vascular/Lymphatic: Atherosclerotic calcifications without abdominal
aortic aneurysm.

Reproductive: Status post hysterectomy. No adnexal masses.

Other: Left pelvic surgical clips. Left upper quadrant surgical
clips. Anterior abdominal wall hernia repair tacks.

Musculoskeletal: Mild thoracolumbar spondylosis. No acute osseous
abnormality.
IMPRESSION: 1. No acute abdominopelvic findings. Specifically, no evidence of
obstructive uropathy.
2. Scattered left-sided colonic diverticulosis without findings of
acute diverticulitis.
3. Small hiatal hernia.
4. Aortic Atherosclerosis ([id number]).

## 2024-01-10 NOTE — Assessment & Plan Note (Addendum)
 Long-standing hypothyroidism with recent Synthroid dosage reduction from 1.25 to 0.88 mg. Recent TSH recheck in April was within normal limits. Symptoms of fatigue and eye watering may be related to thyroid function. Consideration of thyroid dysfunction as a contributing factor to current symptoms. - Recheck TSH levels to assess current thyroid function.

## 2024-01-14 NOTE — Progress Notes (Signed)
 I saw and evaluated the patient, participating in the key portions of the service.  I reviewed the resident???s note.  I agree with the resident???s findings and plan. Charlyne Petrin, MD

## 2024-05-31 NOTE — Telephone Encounter (Signed)
 Patient needs appointment.

## 2024-06-16 ENCOUNTER — Ambulatory Visit: Payer: Self-pay

## 2024-06-16 NOTE — Telephone Encounter (Signed)
 FYI Only or Action Required?: FYI only for provider: appointment scheduled on 06/20/24.  Called Nurse Triage reporting Lupus and Appointment.  Symptoms began several weeks ago.  Interventions attempted: Prescription medications: steroid ointment; out of desonide 0.05% cream and Ice/heat application.  Symptoms are: gradually worsening.  Triage Disposition: See Physician Within 24 Hours  Patient/caregiver understands and will follow disposition?: Yes  Copied from CRM #[id number]. Topic: Clinical - Red Word Triage >> Jun 16, 2024  1:05 PM Nessti S wrote: [facility] that prompted transfer to Nurse Triage: painful sores from lupus   ----------------------------------------------------------------------- From previous Reason for Contact - Scheduling: Patient/patient representative is calling to schedule an appointment. Refer to attachments for appointment information.  Reason for Disposition  [1] Using antibiotic ointment > 24 hours AND [2] new sore occurs    Lupus flare using steroid ointment without improvement  Answer Assessment - Initial Assessment Questions Pt's husband called in requesting appt for today d/t pt having a lupus flare that has lasted nearly 2 weeks and pt is out of medication. He states she is under severe stress with his recent cancer diagnosis and her occupation. He states sores are just worsening all over pt's body. He is not currently with her but requesting an appt with anyone who can see her. Due to lack of availability, scheduled within tier. Soonest appt 12/05. Appointment scheduled for evaluation and pt placed on waitlist. Patient's husband agrees with plan of care, and will call back if anything changes, or if symptoms worsen.     1. APPEARANCE of SORES: What do the sores look like?     Blisters  2. NUMBER: How many sores are there?  Pt's husband reports sore are all over her body      5. ONSET: When did the sores begin?     2 weeks ago  6. TENDER:  Does it hurt when you touch it?  (Scale 1-10; or mild, moderate, severe)   He reports sores are very sore, she wakes up and cries in the morning because they are so sore and she hates how they look.  7. CAUSE: What do you think is causing the sores?     Lupus flare; out of desonide 0.05% cream   8. OTHER SYMPTOMS: Do you have any other symptoms? (e.g., fever, new weakness)     none  Protocols used: Sores-A-AH

## 2024-06-20 ENCOUNTER — Ambulatory Visit: Admitting: Family Medicine
